# Patient Record
Sex: Male | Born: 1960 | Race: White | Hispanic: No | Marital: Married | Smoking: Never smoker
Health system: Southern US, Community
[De-identification: ages and names within clinical notes are randomized; demographics above are authoritative.]

## PROBLEM LIST (undated history)

## (undated) DIAGNOSIS — G4730 Sleep apnea, unspecified: Secondary | ICD-10-CM

---

## 2010-05-14 ENCOUNTER — Ambulatory Visit: Payer: Self-pay | Admitting: Unknown Physician Specialty

## 2010-05-15 LAB — PATHOLOGY REPORT

## 2017-12-17 ENCOUNTER — Encounter: Payer: Self-pay | Admitting: Emergency Medicine

## 2017-12-17 ENCOUNTER — Other Ambulatory Visit: Payer: Self-pay

## 2017-12-17 ENCOUNTER — Emergency Department
Admission: EM | Admit: 2017-12-17 | Discharge: 2017-12-17 | Disposition: A | Discharge status: Home / Self Care | Payer: 59 | Attending: Emergency Medicine | Admitting: Emergency Medicine

## 2017-12-17 DIAGNOSIS — M5416 Radiculopathy, lumbar region: Secondary | ICD-10-CM | POA: Insufficient documentation

## 2017-12-17 DIAGNOSIS — M545 Low back pain: Secondary | ICD-10-CM | POA: Diagnosis present

## 2017-12-17 MED ORDER — KETOROLAC TROMETHAMINE 30 MG/ML IJ SOLN
30.0000 mg | Freq: Once | INTRAMUSCULAR | Status: AC
  Administered 2017-12-17: 30 mg via INTRAMUSCULAR
  Filled 2017-12-17: qty 1

## 2017-12-17 MED ORDER — CYCLOBENZAPRINE HCL 5 MG PO TABS: ORAL_TABLET | ORAL | 0 refills | Status: DC

## 2017-12-17 MED ORDER — METHYLPREDNISOLONE SODIUM SUCC 40 MG IJ SOLR
40.0000 mg | Freq: Once | INTRAMUSCULAR | Status: AC
  Administered 2017-12-17: 40 mg via INTRAMUSCULAR
  Filled 2017-12-17: qty 1

## 2017-12-17 NOTE — ED Provider Notes (Signed)
[facility] Emergency Department Provider Note  ____________________________________________  Time seen: Approximately 3:22 PM  I have reviewed the triage vital signs and the nursing notes.   HISTORY  Chief Complaint Back Pain    HPI Michael Mckenzie is a 57 y.o. male that presents to the emergency department for evaluation of low back pain for 1 week.  Pain starts in low back and radiates into left leg.  Walking and quick  movements aggravate the pain.  Pain radiates into his thigh and does not extend lower.  He has a history of low back pain and sees a chiropractor monthly.  Patient went to the clinic on Monday, received a Toradol injection and a prescription for Toradol.  He never started the prescription for Toradol because pain improved. Pain worsened again at the end of the week, and he went to the clinic this morning and received a prescription for prednisone.  Pain worsened this afternoon so he came to the emergency department.  He denies bowel or bladder dysfunction or saddle paresthesias.  No nausea, vomiting, abdominal pain.   History reviewed. No pertinent past medical history.  There are no active problems to display for this patient.    Prior to Admission medications   Medication Sig Start Date End Date Taking? Authorizing Provider  cyclobenzaprine (FLEXERIL) 5 MG tablet Take 1-2 tablets 3 times daily as needed 12/17/17   Enid DerryWagner, Jasper Ruminski, PA-C    Allergies Patient has no known allergies.  No family history on file.  Social History Social History   Tobacco Use  . Smoking status: Not on file  Substance Use Topics  . Alcohol use: Not on file  . Drug use: Not on file     Review of Systems  Constitutional: No fever/chills Cardiovascular: No chest pain. Respiratory: No SOB. Gastrointestinal: No abdominal pain.  No nausea, no vomiting.  Musculoskeletal: Positive for back pain. Skin: Negative for rash, abrasions, lacerations,  ecchymosis.   ____________________________________________   PHYSICAL EXAM:  VITAL SIGNS: ED Triage Vitals  Enc Vitals Group     BP 12/17/17 1346 135/79     Pulse Rate 12/17/17 1346 97     Resp 12/17/17 1346 18     Temp 12/17/17 1346 98 F (36.7 C)     Temp Source 12/17/17 1346 Oral     SpO2 12/17/17 1346 97 %     Weight 12/17/17 1347 190 lb (86.2 kg)     Height 12/17/17 1347 5\' 10"  (1.778 m)     Head Circumference --      Peak Flow --      Pain Score 12/17/17 1346 0     Pain Loc --      Pain Edu? --      Excl. in GC? --      Constitutional: Alert and oriented. Well appearing and in no acute distress. Eyes: Conjunctivae are normal. PERRL. EOMI. Head: Atraumatic. ENT:      Ears:      Nose: No congestion/rhinnorhea.      Mouth/Throat: Mucous membranes are moist.  Neck: No stridor.   Cardiovascular: Normal rate, regular rhythm.  Good peripheral circulation. Respiratory: Normal respiratory effort without tachypnea or retractions. Lungs CTAB. Good air entry to the bases with no decreased or absent breath sounds. Gastrointestinal: Bowel sounds 4 quadrants. Soft and nontender to palpation. No guarding or rigidity. No palpable masses. No distention.  Musculoskeletal: Full range of motion to all extremities. No gross deformities appreciated.  No tenderness to  palpation over lumbar spine or paraspinal muscles.  Negative straight leg raise. Normal gait. Neurologic:  Normal speech and language. No gross focal neurologic deficits are appreciated.  Skin:  Skin is warm, dry and intact. No rash noted.   ____________________________________________   LABS (all labs ordered are listed, but only abnormal results are displayed)  Labs Reviewed - No data to display ____________________________________________  EKG   ____________________________________________  RADIOLOGY  No results found.  ____________________________________________    PROCEDURES  Procedure(s)  performed:    Procedures    Medications  methylPREDNISolone sodium succinate (SOLU-MEDROL) 40 mg/mL injection 40 mg (40 mg Intramuscular Given 12/17/17 1540)  ketorolac (TORADOL) 30 MG/ML injection 30 mg (30 mg Intramuscular Given 12/17/17 1541)     ____________________________________________   INITIAL IMPRESSION / ASSESSMENT AND PLAN / ED COURSE  Pertinent labs & imaging results that were available during my care of the patient were reviewed by me and considered in my medical decision making (see chart for details).  Review of the Center CSRS was performed in accordance of the [place] prior to dispensing any controlled drugs.   Patient presented to the emergency department for evaluation of low back pain for 1 week.  Symptoms are consistent with radiculopathy.  Vital signs and exam are reassuring.  Patient was given a shot of IM Solu-Medrol and Toradol in ED.  He will begin his prescriptions for prednisone and Toradol.  He will also be given a prescription for Flexeril.  Imaging was offered and patient would like to wait for outpatient imaging.  Patient is to follow up with PCP as directed. Patient is given ED precautions to return to the ED for any worsening or new symptoms.     ____________________________________________  FINAL CLINICAL IMPRESSION(S) / ED DIAGNOSES  Final diagnoses:  Lumbar radiculopathy      NEW MEDICATIONS STARTED DURING THIS VISIT:  ED Discharge Orders        Ordered    cyclobenzaprine (FLEXERIL) 5 MG tablet     12/17/17 1608          This chart was dictated using voice recognition software/Dragon. Despite best efforts to proofread, errors can occur which can change the meaning. Any change was purely unintentional.    Enid Derry, PA-C 12/17/17 1715    Alphonzo Lemmings Rudy Jew, MD 12/18/17 Moses Manners

## 2017-12-17 NOTE — Discharge Instructions (Signed)
Begin flexeril prescription tonight. Begin prednisone and toradol prescriptions tomorrow.

## 2017-12-17 NOTE — ED Triage Notes (Signed)
Low back pain radiating to L leg, became severe this am with no injury.

## 2017-12-30 ENCOUNTER — Other Ambulatory Visit: Payer: Self-pay | Admitting: Rehabilitation

## 2017-12-30 DIAGNOSIS — M5416 Radiculopathy, lumbar region: Secondary | ICD-10-CM

## 2018-01-12 ENCOUNTER — Ambulatory Visit
Admission: RE | Admit: 2018-01-12 | Discharge: 2018-01-12 | Disposition: A | Discharge status: Home / Self Care | Payer: 59 | Source: Ambulatory Visit | Attending: Rehabilitation | Admitting: Rehabilitation

## 2018-01-12 DIAGNOSIS — M5416 Radiculopathy, lumbar region: Secondary | ICD-10-CM

## 2019-01-04 ENCOUNTER — Telehealth: Payer: Self-pay

## 2019-01-04 NOTE — Telephone Encounter (Signed)
I contacted the pt in regards to his EMG appt scheduled for 01/08/19. I advised due to covid-19 concerns our office is limiting the number of patient's coming into the clinic and we have suspended EMG appt's at this time.  Pt was agreeable to this but requested an e-mail or text message stating the appt has been canceled.  Pt was advised I could send a my chart message and he was agreeable.  I attempted to send the my chart message and was advised the pt did not have an active chart. Pt was called back and left a vm stating I would be unable to send my chart due to account not being active.

## 2019-01-08 ENCOUNTER — Encounter: Payer: 59 | Admitting: Neurology

## 2019-03-01 ENCOUNTER — Other Ambulatory Visit: Payer: Self-pay | Admitting: Rehabilitation

## 2019-03-01 DIAGNOSIS — M5412 Radiculopathy, cervical region: Secondary | ICD-10-CM

## 2019-03-13 ENCOUNTER — Encounter (INDEPENDENT_AMBULATORY_CARE_PROVIDER_SITE_OTHER): Payer: 59 | Admitting: Neurology

## 2019-03-13 ENCOUNTER — Ambulatory Visit
Admission: RE | Admit: 2019-03-13 | Discharge: 2019-03-13 | Disposition: A | Discharge status: Home / Self Care | Payer: 59 | Source: Ambulatory Visit | Attending: Rehabilitation | Admitting: Rehabilitation

## 2019-03-13 ENCOUNTER — Ambulatory Visit (INDEPENDENT_AMBULATORY_CARE_PROVIDER_SITE_OTHER): Payer: 59 | Admitting: Neurology

## 2019-03-13 ENCOUNTER — Other Ambulatory Visit: Payer: Self-pay

## 2019-03-13 DIAGNOSIS — R29898 Other symptoms and signs involving the musculoskeletal system: Secondary | ICD-10-CM

## 2019-03-13 DIAGNOSIS — G5621 Lesion of ulnar nerve, right upper limb: Secondary | ICD-10-CM | POA: Diagnosis not present

## 2019-03-13 DIAGNOSIS — M5412 Radiculopathy, cervical region: Secondary | ICD-10-CM

## 2019-03-13 DIAGNOSIS — Z0289 Encounter for other administrative examinations: Secondary | ICD-10-CM

## 2019-03-13 DIAGNOSIS — G5610 Other lesions of median nerve, unspecified upper limb: Secondary | ICD-10-CM

## 2019-03-13 NOTE — Progress Notes (Signed)
Full Name: Meryle ReadyJohn Frazer Gender: Male MRN #: [id number] Date of Birth: 01/31/1961    Visit Date: 03/13/2019 13:36 Age: [age] Years 0 Months Old Examining Physician: Despina Ariasichard Sater, MD  Referring Physician: Letta Kocherhomas Saullo, MD    History: Mr. Lissa HoardBoone is a 58 year old man with weakness, numbness and atrophy in the right hand.  The numbness is mostly in half of the fourth finger and the fifth finger and adjacent palm.  He first noted the atrophy about 2 months ago.  He has had a little bit of neck pain though nothing severe.  He denies any radiating pain down the arm.  Additionally, he has a history of left wrist trauma (distal radius fracture).  Nerve conduction studies: Right median motor response has a mildly delayed distal latency with normal amplitude and normal forearm conduction.  The left median motor response has a moderately delayed distal latency with reduced amplitude and mildly reduced conduction velocity.  The right ulnar motor response has a borderline delayed distal latency with a markedly reduced amplitude and slowing across the elbow.  The left ulnar motor response was normal.  Right ulnar F-wave response had a delayed latency  Bilateral median sensory responses were slowed across the wrist, left greater than right and the left response also had a reduced amplitude.  The right ulnar sensory response was absent and the left ulnar sensory response was normal.  The radial sensory responses had normal latencies and borderline normal amplitudes.  Electromyography: Needle EMG of selected muscles of the right arm and the left hand was performed. Proximally in the arm, there was mild chronic denervation in the biceps and a mild increase of polyphasic motor units without clear neuropathic recruitment in the deltoid.  He had mixed moderate acute and chronic denervation in the ulnar innervated first dorsal interosseous muscle.  In the left hand, he had mixed mild acute and chronic denervation  in the median innervated APB muscle.  The ulnar innervated first dorsal interosseous was normal.  Impression: This NCV/EMG study shows the following: 1.   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 2.   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   Moderate right median neuropathy at the wrist 4.   Mild chronic right C5 radiculopathy   Richard A. Epimenio FootSater, MD, PhD, FAAN Certified in Neurology, Clinical Neurophysiology, Sleep Medicine, Pain Medicine and Neuroimaging Director, [facility]  [facility] [address], [address] [place], [place] [address] [phone]        [facility]    Nerve / Sites Muscle Latency Ref. Amplitude Ref. Rel Amp Segments Distance Velocity Ref. Area    ms ms mV mV %  cm m/s m/s mVms  R Median - APB     Wrist APB 5.5 ?4.4 4.0 ?4.0 100 Wrist - APB 7   15.8     Upper arm APB 10.0  3.8  94.2 Upper arm - Wrist 22 49 ?49 15.7  L Median - APB     Wrist APB 6.7 ?4.4 2.9 ?4.0 100 Wrist - APB 7   12.4     Upper arm APB 11.9  2.5  85.3 Upper arm - Wrist 22 43 ?49 12.1  R Ulnar - ADM     Wrist ADM 3.5 ?3.3 1.8 ?6.0 100 Wrist - ADM 7   3.7     B.Elbow ADM 8.0  1.7  89.7 B.Elbow - Wrist 20 45 ?  49 3.8     A.Elbow ADM 11.1  1.4  82.5 A.Elbow - B.Elbow 10 32 ?49 3.9         A.Elbow - Wrist      L Ulnar - ADM     Wrist ADM 2.4 ?3.3 9.1 ?6.0 100 Wrist - ADM 7   24.8     B.Elbow ADM 5.4  8.1  88.6 B.Elbow - Wrist 20 66 ?49 23.1     A.Elbow ADM 7.3  7.7  95.1 A.Elbow - B.Elbow 10 52 ?49 22.8         A.Elbow - Wrist                 SNC    Nerve / Sites Rec. Site Peak Lat Ref.  Amp Ref. Segments Distance    ms ms V V  cm  R Radial - Anatomical snuff box (Forearm)     Forearm Wrist 2.7 ?2.9 14 ?15 Forearm - Wrist 10  L Radial - Anatomical snuff box (Forearm)     Forearm Wrist 2.6 ?2.9 15 ?15 Forearm - Wrist 10  R Median - Orthodromic (Dig II, Mid palm)     Dig II Wrist 4.6 ?3.4  10 ?10 Dig II - Wrist 13  L Median - Orthodromic (Dig II, Mid palm)     Dig II Wrist 5.4 ?3.4 3 ?10 Dig II - Wrist 13  R Ulnar - Orthodromic, (Dig V, Mid palm)     Dig V Wrist NR ?3.1 NR ?5 Dig V - Wrist 11  L Ulnar - Orthodromic, (Dig V, Mid palm)     Dig V Wrist 2.7 ?3.1 5 ?5 Dig V - Wrist 22                 F  Wave    Nerve F Lat Ref.   ms ms  R Ulnar - ADM 35.5 ?32.0  L Ulnar - ADM 30.6 ?32.0         EMG full       EMG Summary Table    Spontaneous MUAP Recruitment  Muscle IA Fib PSW Fasc Other Amp Dur. Poly Pattern  R. Biceps brachii Normal None None None _______ Increased Increased 1+ Reduced  R. Deltoid Normal None None None _______ Normal Normal 1+ Normal  R. Triceps brachii Normal None None None _______ Normal Normal 1+ Normal  R. Extensor digitorum communis Normal None None None _______ Normal Normal 1+ Normal  R. Pronator teres Normal None None None _______ Normal Normal Normal Normal  R. Flexor carpi ulnaris Normal None None None _______ Normal Normal Normal Normal  R. First dorsal interosseous Normal 2+ 2+ None _______ Normal Normal 2+ Single  R. Abductor pollicis brevis Normal None None None _______ Normal Normal Normal Normal  L. Abductor pollicis brevis Normal 1+ 1+ None _______ Increased Increased 2+ Reduced  L. First dorsal interosseous Normal None None None _______ Normal Normal Normal Normal

## 2019-03-23 ENCOUNTER — Other Ambulatory Visit: Payer: Self-pay | Admitting: Rehabilitation

## 2019-03-23 DIAGNOSIS — M50121 Cervical disc disorder at C4-C5 level with radiculopathy: Secondary | ICD-10-CM | POA: Insufficient documentation

## 2019-03-23 DIAGNOSIS — G5603 Carpal tunnel syndrome, bilateral upper limbs: Secondary | ICD-10-CM | POA: Insufficient documentation

## 2019-03-23 DIAGNOSIS — M5136 Other intervertebral disc degeneration, lumbar region: Secondary | ICD-10-CM

## 2019-03-30 ENCOUNTER — Encounter: Payer: 59 | Admitting: Neurology

## 2019-04-16 ENCOUNTER — Ambulatory Visit
Admission: RE | Admit: 2019-04-16 | Discharge: 2019-04-16 | Disposition: A | Discharge status: Home / Self Care | Payer: 59 | Source: Ambulatory Visit | Attending: Rehabilitation | Admitting: Rehabilitation

## 2019-04-16 ENCOUNTER — Other Ambulatory Visit: Payer: Self-pay

## 2019-04-16 DIAGNOSIS — M5136 Other intervertebral disc degeneration, lumbar region: Secondary | ICD-10-CM

## 2019-04-26 ENCOUNTER — Other Ambulatory Visit: Payer: Self-pay | Admitting: Orthopaedic Surgery

## 2019-04-26 DIAGNOSIS — M5412 Radiculopathy, cervical region: Secondary | ICD-10-CM

## 2019-05-09 ENCOUNTER — Other Ambulatory Visit: Payer: Self-pay

## 2019-05-09 ENCOUNTER — Ambulatory Visit
Admission: RE | Admit: 2019-05-09 | Discharge: 2019-05-09 | Disposition: A | Discharge status: Home / Self Care | Payer: 59 | Source: Ambulatory Visit | Attending: Orthopaedic Surgery | Admitting: Orthopaedic Surgery

## 2019-05-09 DIAGNOSIS — M5412 Radiculopathy, cervical region: Secondary | ICD-10-CM

## 2019-05-11 ENCOUNTER — Other Ambulatory Visit: Payer: Self-pay | Admitting: Orthopaedic Surgery

## 2019-05-11 DIAGNOSIS — M5126 Other intervertebral disc displacement, lumbar region: Secondary | ICD-10-CM

## 2019-05-15 ENCOUNTER — Other Ambulatory Visit: Payer: Self-pay

## 2019-05-15 ENCOUNTER — Ambulatory Visit
Admission: RE | Admit: 2019-05-15 | Discharge: 2019-05-15 | Disposition: A | Discharge status: Home / Self Care | Payer: 59 | Source: Ambulatory Visit | Attending: Orthopaedic Surgery | Admitting: Orthopaedic Surgery

## 2019-05-15 DIAGNOSIS — M5126 Other intervertebral disc displacement, lumbar region: Secondary | ICD-10-CM

## 2019-05-15 MED ORDER — GADOBENATE DIMEGLUMINE 529 MG/ML IV SOLN
17.0000 mL | Freq: Once | INTRAVENOUS | Status: AC | PRN
  Administered 2019-05-15: 17 mL via INTRAVENOUS

## 2021-03-25 DIAGNOSIS — K5790 Diverticulosis of intestine, part unspecified, without perforation or abscess without bleeding: Secondary | ICD-10-CM | POA: Insufficient documentation

## 2021-07-21 ENCOUNTER — Other Ambulatory Visit: Payer: Self-pay | Admitting: Orthopedic Surgery

## 2021-09-08 ENCOUNTER — Other Ambulatory Visit: Payer: Self-pay

## 2021-09-08 ENCOUNTER — Encounter (HOSPITAL_BASED_OUTPATIENT_CLINIC_OR_DEPARTMENT_OTHER): Payer: Self-pay | Admitting: Orthopedic Surgery

## 2021-09-15 ENCOUNTER — Ambulatory Visit (HOSPITAL_BASED_OUTPATIENT_CLINIC_OR_DEPARTMENT_OTHER)
Admission: RE | Admit: 2021-09-15 | Discharge: 2021-09-15 | Disposition: A | Discharge status: Home / Self Care | Payer: 59 | Source: Ambulatory Visit | Attending: Orthopedic Surgery | Admitting: Orthopedic Surgery

## 2021-09-15 ENCOUNTER — Encounter (HOSPITAL_BASED_OUTPATIENT_CLINIC_OR_DEPARTMENT_OTHER): Payer: Self-pay | Admitting: Orthopedic Surgery

## 2021-09-15 ENCOUNTER — Encounter (HOSPITAL_BASED_OUTPATIENT_CLINIC_OR_DEPARTMENT_OTHER)
Admission: RE | Disposition: A | Discharge status: Home / Self Care | Payer: Self-pay | Source: Ambulatory Visit | Attending: Orthopedic Surgery

## 2021-09-15 ENCOUNTER — Ambulatory Visit (HOSPITAL_BASED_OUTPATIENT_CLINIC_OR_DEPARTMENT_OTHER): Payer: 59 | Admitting: Anesthesiology

## 2021-09-15 ENCOUNTER — Other Ambulatory Visit: Payer: Self-pay

## 2021-09-15 DIAGNOSIS — G5603 Carpal tunnel syndrome, bilateral upper limbs: Secondary | ICD-10-CM | POA: Insufficient documentation

## 2021-09-15 DIAGNOSIS — G4730 Sleep apnea, unspecified: Secondary | ICD-10-CM | POA: Insufficient documentation

## 2021-09-15 DIAGNOSIS — G5601 Carpal tunnel syndrome, right upper limb: Secondary | ICD-10-CM | POA: Diagnosis present

## 2021-09-15 HISTORY — DX: Sleep apnea, unspecified: G47.30

## 2021-09-15 HISTORY — PX: CARPAL TUNNEL RELEASE: SHX101

## 2021-09-15 HISTORY — PX: ULNAR NERVE TRANSPOSITION: SHX2595

## 2021-09-15 SURGERY — CARPAL TUNNEL RELEASE
Anesthesia: Monitor Anesthesia Care | Site: Wrist | Laterality: Right

## 2021-09-15 MED ORDER — LIDOCAINE HCL (CARDIAC) PF 100 MG/5ML IV SOSY
PREFILLED_SYRINGE | INTRAVENOUS | Status: DC | PRN
  Administered 2021-09-15: 40 mg via INTRAVENOUS

## 2021-09-15 MED ORDER — ACETAMINOPHEN 500 MG PO TABS
ORAL_TABLET | ORAL | Status: AC
  Filled 2021-09-15: qty 2

## 2021-09-15 MED ORDER — CEFAZOLIN SODIUM-DEXTROSE 2-4 GM/100ML-% IV SOLN
2.0000 g | INTRAVENOUS | Status: AC
  Administered 2021-09-15: 2 g via INTRAVENOUS

## 2021-09-15 MED ORDER — FENTANYL CITRATE (PF) 100 MCG/2ML IJ SOLN: 25.0000 ug | INTRAMUSCULAR | Status: DC | PRN

## 2021-09-15 MED ORDER — ONDANSETRON HCL 4 MG/2ML IJ SOLN
INTRAMUSCULAR | Status: DC | PRN
  Administered 2021-09-15: 4 mg via INTRAVENOUS

## 2021-09-15 MED ORDER — ROPIVACAINE HCL 5 MG/ML IJ SOLN
INTRAMUSCULAR | Status: DC | PRN
  Administered 2021-09-15: 25 mL via PERINEURAL

## 2021-09-15 MED ORDER — BUPIVACAINE HCL (PF) 0.25 % IJ SOLN
INTRAMUSCULAR | Status: AC
  Filled 2021-09-15: qty 180

## 2021-09-15 MED ORDER — FENTANYL CITRATE (PF) 100 MCG/2ML IJ SOLN
INTRAMUSCULAR | Status: AC
  Filled 2021-09-15: qty 2

## 2021-09-15 MED ORDER — TRAMADOL HCL 50 MG PO TABS: 50.0000 mg | ORAL_TABLET | Freq: Four times a day (QID) | ORAL | 0 refills | Status: DC | PRN

## 2021-09-15 MED ORDER — DEXAMETHASONE SODIUM PHOSPHATE 10 MG/ML IJ SOLN
INTRAMUSCULAR | Status: DC | PRN
  Administered 2021-09-15: 5 mg

## 2021-09-15 MED ORDER — 0.9 % SODIUM CHLORIDE (POUR BTL) OPTIME
TOPICAL | Status: DC | PRN
  Administered 2021-09-15: 200 mL

## 2021-09-15 MED ORDER — MIDAZOLAM HCL 2 MG/2ML IJ SOLN
2.0000 mg | Freq: Once | INTRAMUSCULAR | Status: AC
  Administered 2021-09-15: 2 mg via INTRAVENOUS

## 2021-09-15 MED ORDER — LACTATED RINGERS IV SOLN: INTRAVENOUS | Status: DC

## 2021-09-15 MED ORDER — PROPOFOL 500 MG/50ML IV EMUL
INTRAVENOUS | Status: DC | PRN
  Administered 2021-09-15: 75 ug/kg/min via INTRAVENOUS

## 2021-09-15 MED ORDER — CEFAZOLIN SODIUM-DEXTROSE 2-4 GM/100ML-% IV SOLN
INTRAVENOUS | Status: AC
  Filled 2021-09-15: qty 100

## 2021-09-15 MED ORDER — MIDAZOLAM HCL 2 MG/2ML IJ SOLN
INTRAMUSCULAR | Status: AC
  Filled 2021-09-15: qty 2

## 2021-09-15 MED ORDER — PROPOFOL 10 MG/ML IV BOLUS
INTRAVENOUS | Status: AC
  Filled 2021-09-15: qty 20

## 2021-09-15 MED ORDER — FENTANYL CITRATE (PF) 100 MCG/2ML IJ SOLN
100.0000 ug | Freq: Once | INTRAMUSCULAR | Status: AC
  Administered 2021-09-15: 50 ug via INTRAVENOUS

## 2021-09-15 MED ORDER — ACETAMINOPHEN 500 MG PO TABS
1000.0000 mg | ORAL_TABLET | Freq: Once | ORAL | Status: AC
  Administered 2021-09-15: 1000 mg via ORAL

## 2021-09-15 MED ORDER — PHENYLEPHRINE HCL (PRESSORS) 10 MG/ML IV SOLN
INTRAVENOUS | Status: DC | PRN
  Administered 2021-09-15: 40 ug via INTRAVENOUS

## 2021-09-15 MED ORDER — PROPOFOL 10 MG/ML IV BOLUS
INTRAVENOUS | Status: DC | PRN
  Administered 2021-09-15 (×2): 20 mg via INTRAVENOUS

## 2021-09-15 SURGICAL SUPPLY — 40 items
APL PRP STRL LF DISP 70% ISPRP (MISCELLANEOUS) ×2
BLADE SURG 15 STRL LF DISP TIS (BLADE) ×2 IMPLANT
BLADE SURG 15 STRL SS (BLADE) ×3
BNDG CMPR 9X4 STRL LF SNTH (GAUZE/BANDAGES/DRESSINGS) ×2
BNDG COHESIVE 3X5 TAN ST LF (GAUZE/BANDAGES/DRESSINGS) ×6 IMPLANT
BNDG ESMARK 4X9 LF (GAUZE/BANDAGES/DRESSINGS) ×3 IMPLANT
BNDG GAUZE ELAST 4 BULKY (GAUZE/BANDAGES/DRESSINGS) ×3 IMPLANT
CHLORAPREP W/TINT 26 (MISCELLANEOUS) ×3 IMPLANT
CORD BIPOLAR FORCEPS 12FT (ELECTRODE) ×3 IMPLANT
COVER BACK TABLE 60X90IN (DRAPES) ×3 IMPLANT
COVER MAYO STAND STRL (DRAPES) ×3 IMPLANT
DRAPE EXTREMITY T 121X128X90 (DISPOSABLE) ×3 IMPLANT
DRAPE U-SHAPE 47X51 STRL (DRAPES) ×3 IMPLANT
DRSG PAD ABDOMINAL 8X10 ST (GAUZE/BANDAGES/DRESSINGS) ×3 IMPLANT
GAUZE 4X4 16PLY ~~LOC~~+RFID DBL (SPONGE) ×3 IMPLANT
GAUZE SPONGE 4X4 12PLY STRL (GAUZE/BANDAGES/DRESSINGS) ×3 IMPLANT
GAUZE XEROFORM 1X8 LF (GAUZE/BANDAGES/DRESSINGS) ×3 IMPLANT
GLOVE SURG ENC TEXT LTX SZ7.5 (GLOVE) ×3 IMPLANT
GLOVE SURG ORTHO LTX SZ8 (GLOVE) ×3 IMPLANT
GLOVE SURG POLYISO LF SZ6.5 (GLOVE) ×3 IMPLANT
GLOVE SURG UNDER POLY LF SZ6.5 (GLOVE) ×3 IMPLANT
GLOVE SURG UNDER POLY LF SZ8.5 (GLOVE) ×3 IMPLANT
GOWN STRL REUS W/ TWL LRG LVL3 (GOWN DISPOSABLE) ×4 IMPLANT
GOWN STRL REUS W/TWL LRG LVL3 (GOWN DISPOSABLE) ×6
GOWN STRL REUS W/TWL XL LVL3 (GOWN DISPOSABLE) ×3 IMPLANT
NS IRRIG 1000ML POUR BTL (IV SOLUTION) ×3 IMPLANT
PACK BASIN DAY SURGERY FS (CUSTOM PROCEDURE TRAY) ×3 IMPLANT
PAD CAST 4YDX4 CTTN HI CHSV (CAST SUPPLIES) ×2 IMPLANT
PADDING CAST COTTON 4X4 STRL (CAST SUPPLIES) ×3
SLING ARM FOAM STRAP LRG (SOFTGOODS) ×3 IMPLANT
SPLINT PLASTER CAST XFAST 3X15 (CAST SUPPLIES) ×60 IMPLANT
SPLINT PLASTER XTRA FASTSET 3X (CAST SUPPLIES) ×30
STOCKINETTE 4X48 STRL (DRAPES) ×3 IMPLANT
SUT ETHILON 4 0 PS 2 18 (SUTURE) ×6 IMPLANT
SUT VIC AB 2-0 SH 27 (SUTURE) ×3
SUT VIC AB 2-0 SH 27XBRD (SUTURE) ×2 IMPLANT
SUT VICRYL 4-0 PS2 18IN ABS (SUTURE) ×3 IMPLANT
SYR BULB EAR ULCER 3OZ GRN STR (SYRINGE) ×3 IMPLANT
TOWEL GREEN STERILE FF (TOWEL DISPOSABLE) ×3 IMPLANT
UNDERPAD 30X36 HEAVY ABSORB (UNDERPADS AND DIAPERS) ×3 IMPLANT

## 2021-09-15 NOTE — Brief Op Note (Signed)
09/15/2021  10:00 AM  PATIENT:  Michael Mckenzie  60 y.o. male  PRE-OPERATIVE DIAGNOSIS:  RIGHT CARPAL TUNNEL SYNDROME, RIGHT CUBITAL TUNNEL SYNDROME, RIGHT GUYON'S CANAL  POST-OPERATIVE DIAGNOSIS:  RIGHT CARPAL TUNNEL SYNDROME, RIGHT CUBITAL TUNNEL SYNDROME, RIGHT GUYON'S CANAL  PROCEDURE:  Procedure(s): RIGHT CARPAL TUNNEL RELEASE (Right) DECOMPRESSION WITH TRANSPOSITION ULNAR NERVE ELBOW AND ULNAR NERVE WRIST (Right)  SURGEON:  Surgeon(s) and Role:    Cindee Salt, MD - Primary  PHYSICIAN ASSISTANT:   ASSISTANTS: R Dasnoit PA-C  ANESTHESIA:   regional and IV sedation  EBL: 5 ml  BLOOD ADMINISTERED:none  DRAINS: none   LOCAL MEDICATIONS USED:  NONE  SPECIMEN:  No Specimen  DISPOSITION OF SPECIMEN:  N/A  COUNTS:  YES  TOURNIQUET:  * Missing tourniquet times found for documented tourniquets in log: [id number] *  DICTATION: .Reubin Milan Dictation  PLAN OF CARE: Discharge to home after PACU  PATIENT DISPOSITION:  PACU - hemodynamically stable.

## 2021-09-15 NOTE — Transfer of Care (Signed)
Immediate Anesthesia Transfer of Care Note  Patient: Michael Mckenzie  Procedure(s) Performed: RIGHT CARPAL TUNNEL RELEASE (Right: Wrist) DECOMPRESSION WITH TRANSPOSITION ULNAR NERVE ELBOW AND ULNAR NERVE WRIST (Right: Elbow)  Patient Location: PACU  Anesthesia Type:MAC combined with regional for post-op pain  Level of Consciousness: awake, alert  and oriented  Airway & Oxygen Therapy: Patient Spontanous Breathing and Patient connected to face mask oxygen  Post-op Assessment: Report given to RN and Post -op Vital signs reviewed and stable  Post vital signs: Reviewed and stable  Last Vitals:  Vitals Value Taken Time  BP 119/77 09/15/21 1022  Temp    Pulse 82 09/15/21 1024  Resp 16 09/15/21 1024  SpO2 96 % 09/15/21 1024  Vitals shown include unvalidated device data.  Last Pain:  Vitals:   09/15/21 0706  TempSrc: Oral  PainSc: 0-No pain      Patients Stated Pain Goal: 4 (83/67/25 5001)  Complications: No notable events documented.

## 2021-09-15 NOTE — Op Note (Signed)
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   ASSISTANT: Annye Rusk, [facility]   ANESTHESIA:  Regional with sedation   INTRAVENOUS FLUIDS:  Per anesthesia flow sheet.   ESTIMATED BLOOD LOSS:  Minimal.   COMPLICATIONS:  None.   SPECIMENS:  none   TOURNIQUET TIME:   * Missing tourniquet times found for documented tourniquets in log: [id number] *   DISPOSITION:  Stable to PACU.   INDICATIONS: Patient is a 60 year old male with a history of numbness and tingling bilateral arms.  He has undergone a cervical fusion and decompression of the ulnar nerve at his right elbow.  He has had continued symptoms with positive nerve conductions with a compression of the ulnar nerve at his elbow ulnar nerve at his wrist and median nerve at his wrist.  He is admitted for decompression of the median and ulnar nerve at his wrist and decompression possible transposition to the ulnar nerve at his elbow right side.  Pre-.  Postoperative course been discussed along with risks and complications.  He is aware that there is no guarantee to the surgery the possibility of infection recurrence injury to arteries nerves tendons incomplete relief symptoms dystrophy.  The preoperative area the patient is seen the extremity marked by both patient and surgeon antibiotic given.  A supraclavicular block was carried out without difficulty under the direction the anesthesia department.  OPERATIVE COURSE: Patient is brought to the operating room  placed in a supine position with the right arm free.  Prep was done with ChloraPrep.  A 3-minute dry time was allowed timeout taken confirming patient and procedure.  The limb was exsanguinated with an Esmarch bandage turn placed high on the arm was inflated to 250 mmHg.  The wrist was approached first.  A longitudinal incision was made in the palm right side carried down through subcutaneous tissue this was then brought to the ulnar side just proximal to the Cibolo form.  This carried down through subcutaneous tissue.  Bleeders were electrocauterized with bipolar.  The median nerve was identified proximally and traced distally releasing the flexor retinaculum to the level of the superficial palmar arch.  Motor branch entered into muscle distally.  The area compression of the nerve was apparent.  No further lesions were identified.  The ulnar nerve was then identified at the wrist level just proximal to the Pisiform along with the ulnar artery.  This was traced distally releasing Guyon's canal.  The superficial branch was released over its entire course.  The motor branch was then identified and traced through to the hook of the hamate releasing the muscle from above it.  Further lesions were identified.  The wound was copiously irrigated with saline.  The skin was closed interrupted 4-0 nylon sutures.  Attention was then directed proximally.  A incision was then made using the old incision on the elbow carried proximally and distally.  Dissection was carried through subcu subcutaneous tissue bleeders were again electrocauterized with bipolar.  The ulnar nerve was identified identified proximally.  It  was also identified distally as entered into the flexor carpi ulnaris muscle which was split allowing visualization of the normal nerve distally this was then traced proximally and distally freeing the entire ulnar nerve from a considerable amount of encasing scar.  Decided at this point to transpose the subcutaneously.   The medial intermuscular septum was then released proximally down to the medial epicondyle of the elbow where it was left attached.  A portion of the origin of the flexor carpi ulnaris was then detached from the muscle and followed proximally to the its attachment on the medial epicondyle where it was left attached this form to slings.  The nerve was then anteriorly transposed.  The 2 slings proximally and distally were then sutured to the subcutaneous tissue on the anterior flap with flexion extension the nerve glided through the area.  The suturing was done with 2-0 Vicryl sutures.  The wound was copiously irrigated with saline and closed in layers with 4 -0 Vicryl the subcutaneous tissue and 4-0 nylon in the skin.  Sterile compressive dressing and splint to the elbow in 90 degrees of flexion was applied.  Deflation of the tourniquet all fingers immediately pink.  He was taken to the recovery room for observation in satisfactory condition.  He will be discharged home to return to the hand center [facility] in 1 week Tylenol on Tylenol ibuprofen for pain with Ultram for breakthrough.  Cindee Salt, MD Electronically signed, 09/15/21

## 2021-09-15 NOTE — H&P (Signed)
Michael Mckenzie is an 60 y.o. male.   Chief Complaint: numbness right hand HPI: Michael Mckenzie is a 60 year old right-hand-dominant male who was last seen on 03/23/2019. He was seen at that time and diagnosed with bilateral carpal tunnel syndrome ulnar neuropathy at his elbow and cervical disc disorder with radiculopathy. He was referred to Dr. Noel Gerold at that time for examination of his neck. 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 He did not ever see Dr. Noel Gerold. [facility] by Dr. Peyton Najjar who performed a cervical fusion 4-6 along with a cubital tunnel decompression in August 2020. He did not have the carpal tunnels released at that time. He has done well until approximately a week ago but has begun having some pain in his right middle finger with a feeling of tenderness soreness at the PIP joint he took meloxicam which has helped. He does have some numbness in in the area. He has no history of injury. States nothing seems to make it better or worse. He states it seems to be improving. He has no history of diabetes thyroid problems. He has no gout. He was scheduled for nerve conductions which have been done by by Dr. Raelene Bott. These reveal carpal tunnel syndrome bilaterally with a motor delay of 5 7 on the right 8 2 on the left ulnar nerve is also slow on his right side he has a ulnar neuropathy at the elbow bilaterally. He also has a chronic see 5 radiculopathy with a history of C4-5 cervical surgery there is a C8 radiculopathy in addition. His hands have settled down somewhat he continues complain numbness and tingling. This more on the right than the left. Right side bothers him more than the left. He states nothing makes it better or worse. He has no history of diabetes thyroid problems. He does not have a history of gout. Does have history of arthritis Past Medical History:   Diagnosis Date   Sleep apnea     History reviewed. No pertinent surgical history.  History reviewed. No pertinent family history. Social History:  reports that he has never smoked. He quit smokeless tobacco use about 10 years ago. He reports that he does not drink alcohol and does not use drugs.  Allergies: No Known Allergies  No medications prior to admission.    No results found for this or any previous visit (from the past 48 hour(s)).  No results found.   Pertinent items are noted in HPI.  Height 5\' 11"  (1.803 m), weight 81.6 kg.  General appearance: alert, cooperative, and appears stated age Head: Normocephalic, without obvious abnormality Neck: no JVD Resp: clear to auscultation bilaterally Cardio: regular rate and rhythm, S1, S2 normal, no murmur, click, rub or gallop GI: soft, non-tender; bowel sounds normal; no masses,  no organomegaly Extremities: numbness right hand Pulses: 2+ and symmetric Skin: Skin color, texture, turgor normal. No rashes or lesions Neurologic: Grossly normal Incision/Wound: na  Assessment/Plan Diagnosis is carpal tunnel bilateral cubital tunnel syndrome bilateral and compression ulnar nerve the Guyon's canal on the right Plan: We have personally reviewed his nerve conductions and discussed these with him. We have discussed possibility of surgical decompression of the median nerve on his right wrist along with ulnar nerve at elbow and wrist. He is aware that we are attempting to halt the process of allowing the nerve the opportunity to get better  but cannot guarantee that. Preperi-and postoperative course are discussed along with risk and complications. He is advised of the potential for injury to arteries nerves tendons possibility of infection incomplete relief of symptoms dystrophy. He would like to proceed and is scheduled for decompression possible transposition ulnar nerve at his right elbow carpal tunnel at his wrist  and release Guyon's  canal right wrist as an outpatient under regional anesthesia Cindee Salt 09/15/2021, 5:16 AM

## 2021-09-15 NOTE — Anesthesia Preprocedure Evaluation (Addendum)
Anesthesia Evaluation  Patient identified by MRN, date of birth, ID band Patient awake    Reviewed: Allergy & Precautions, NPO status , Patient's Chart, lab work & pertinent test results  Airway Mallampati: III  TM Distance: >3 FB Neck ROM: Full    Dental no notable dental hx. (+) Teeth Intact, Dental Advisory Given   Pulmonary sleep apnea ,    Pulmonary exam normal breath sounds clear to auscultation       Cardiovascular negative cardio ROS Normal cardiovascular exam Rhythm:Regular Rate:Normal     Neuro/Psych negative neurological ROS  negative psych ROS   GI/Hepatic negative GI ROS, Neg liver ROS,   Endo/Other  negative endocrine ROS  Renal/GU negative Renal ROS  negative genitourinary   Musculoskeletal negative musculoskeletal ROS (+)   Abdominal   Peds  Hematology negative hematology ROS (+)   Anesthesia Other Findings   Reproductive/Obstetrics                            Anesthesia Physical Anesthesia Plan  ASA: 2  Anesthesia Plan: MAC and Regional   Post-op Pain Management: Regional block and Tylenol PO (pre-op)   Induction: Intravenous  PONV Risk Score and Plan: 1 and Propofol infusion, Treatment may vary due to age or medical condition, Midazolam and Ondansetron  Airway Management Planned: Natural Airway  Additional Equipment:   Intra-op Plan:   Post-operative Plan:   Informed Consent: I have reviewed the patients History and Physical, chart, labs and discussed the procedure including the risks, benefits and alternatives for the proposed anesthesia with the patient or authorized representative who has indicated his/her understanding and acceptance.     Dental advisory given  Plan Discussed with: CRNA  Anesthesia Plan Comments:         Anesthesia Quick Evaluation

## 2021-09-15 NOTE — Anesthesia Postprocedure Evaluation (Signed)
Anesthesia Post Note  Patient: Michael Mckenzie  Procedure(s) Performed: RIGHT CARPAL TUNNEL RELEASE (Right: Wrist) DECOMPRESSION WITH TRANSPOSITION ULNAR NERVE ELBOW AND ULNAR NERVE WRIST (Right: Elbow)     Patient location during evaluation: PACU Anesthesia Type: Regional and MAC Level of consciousness: awake and alert Pain management: pain level controlled Vital Signs Assessment: post-procedure vital signs reviewed and stable Respiratory status: spontaneous breathing, nonlabored ventilation, respiratory function stable and patient connected to nasal cannula oxygen Cardiovascular status: stable and blood pressure returned to baseline Postop Assessment: no apparent nausea or vomiting Anesthetic complications: no   No notable events documented.  Last Vitals:  Vitals:   09/15/21 1045 09/15/21 1126  BP: 112/79 (!) 115/103  Pulse: 81 82  Resp: 17 18  Temp:  36.4 C  SpO2: 96% 96%    Last Pain:  Vitals:   09/15/21 1126  TempSrc:   PainSc: 0-No pain                 Juanya Villavicencio L Para Cossey

## 2021-09-15 NOTE — Progress Notes (Signed)
Assisted Dr. Woodrum with right, ultrasound guided, supraclavicular block. Side rails up, monitors on throughout procedure. See vital signs in flow sheet. Tolerated Procedure well. 

## 2021-09-15 NOTE — Discharge Instructions (Addendum)
[facility] Instructions Hand Surgery  Wound Care: Keep your hand elevated above the level of your heart.  Do not allow it to dangle by your side.  Keep the dressing dry and do not remove it unless your doctor advises you to do so.  He will usually change it at the time of your post-op visit.  Moving your fingers is advised to stimulate circulation but will depend on the site of your surgery.  If you have a splint applied, your doctor will advise you regarding movement.  Activity: Do not drive or operate machinery today.  Rest today and then you may return to your normal activity and work as indicated by your physician.  Diet:  Drink liquids today or eat a light diet.  You may resume a regular diet tomorrow.    General expectations: Pain for two to three days. Fingers may become slightly swollen.  Call your doctor if any of the following occur: Severe pain not relieved by pain medication. Elevated temperature. Dressing soaked with blood. Inability to move fingers. White or bluish color to fingers.   Next dose of OTC Tylenol after 1:20 pm as needed for pain.  Regional Anesthesia Blocks  1. Numbness or the inability to move the "blocked" extremity may last from 3-48 hours after placement. The length of time depends on the medication injected and your individual response to the medication. If the numbness is not going away after 48 hours, call your surgeon.  2. The extremity that is blocked will need to be protected until the numbness is gone and the  Strength has returned. Because you cannot feel it, you will need to take extra care to avoid injury. Because it may be weak, you may have difficulty moving it or using it. You may not know what position it is in without looking at it while the block is in effect.  3. For blocks in the legs and feet, returning to weight bearing and walking needs to be done carefully. You will need to wait until the numbness is entirely gone and the strength  has returned. You should be able to move your leg and foot normally before you try and bear weight or walk. You will need someone to be with you when you first try to ensure you do not fall and possibly risk injury.  4. Bruising and tenderness at the needle site are common side effects and will resolve in a few days.  5. Persistent numbness or new problems with movement should be communicated to the surgeon or the [facility] [phone] [facility] [phone]).    Post Anesthesia Home Care Instructions  Activity: Get plenty of rest for the remainder of the day. A responsible individual must stay with you for 24 hours following the procedure.  For the next 24 hours, DO NOT: -Drive a car -Advertising copywriter -Drink alcoholic beverages -Take any medication unless instructed by your physician -Make any legal decisions or sign important papers.  Meals: Start with liquid foods such as gelatin or soup. Progress to regular foods as tolerated. Avoid greasy, spicy, heavy foods. If nausea and/or vomiting occur, drink only clear liquids until the nausea and/or vomiting subsides. Call your physician if vomiting continues.  Special Instructions/Symptoms: Your throat may feel dry or sore from the anesthesia or the breathing tube placed in your throat during surgery. If this causes discomfort, gargle with warm salt water. The discomfort should disappear within 24 hours.  If you had a  scopolamine patch placed behind your ear for the management of post- operative nausea and/or vomiting:  1. The medication in the patch is effective for 72 hours, after which it should be removed.  Wrap patch in a tissue and discard in the trash. Wash hands thoroughly with soap and water. 2. You may remove the patch earlier than 72 hours if you experience unpleasant side effects which may include dry mouth, dizziness or visual disturbances. 3. Avoid touching the patch. Wash your hands with soap  and water after contact with the patch.

## 2021-09-15 NOTE — Anesthesia Procedure Notes (Signed)
Anesthesia Regional Block: Supraclavicular block   Pre-Anesthetic Checklist: , timeout performed,  Correct Patient, Correct Site, Correct Laterality,  Correct Procedure, Correct Position, site marked,  Risks and benefits discussed,  Surgical consent,  Pre-op evaluation,  At surgeon's request and post-op pain management  Laterality: Right  Prep: Maximum Sterile Barrier Precautions used, chloraprep       Needles:  Injection technique: Single-shot  Needle Type: Echogenic Stimulator Needle     Needle Length: 5cm  Needle Gauge: 22     Additional Needles:   Procedures:,,,, ultrasound used (permanent image in chart),,    Narrative:  Start time: 09/15/2021 8:05 AM End time: 09/15/2021 8:10 AM Injection made incrementally with aspirations every 5 mL.  Performed by: Personally  Anesthesiologist: Elmer Picker, MD  Additional Notes: Monitors applied. No increased pain on injection. No increased resistance to injection. Injection made in 5cc increments. Good needle visualization. Patient tolerated procedure well.

## 2021-09-17 ENCOUNTER — Encounter (HOSPITAL_BASED_OUTPATIENT_CLINIC_OR_DEPARTMENT_OTHER): Payer: Self-pay | Admitting: Orthopedic Surgery

## 2021-11-06 ENCOUNTER — Other Ambulatory Visit: Payer: Self-pay | Admitting: Neurosurgery

## 2021-11-06 DIAGNOSIS — M5136 Other intervertebral disc degeneration, lumbar region: Secondary | ICD-10-CM

## 2021-11-06 DIAGNOSIS — M898X8 Other specified disorders of bone, other site: Secondary | ICD-10-CM

## 2021-11-06 DIAGNOSIS — M5416 Radiculopathy, lumbar region: Secondary | ICD-10-CM

## 2021-11-16 ENCOUNTER — Other Ambulatory Visit: Payer: Self-pay

## 2021-11-16 ENCOUNTER — Ambulatory Visit
Admission: RE | Admit: 2021-11-16 | Discharge: 2021-11-16 | Disposition: A | Discharge status: Home / Self Care | Payer: 59 | Source: Ambulatory Visit | Attending: Neurosurgery | Admitting: Neurosurgery

## 2021-11-16 DIAGNOSIS — M898X8 Other specified disorders of bone, other site: Secondary | ICD-10-CM | POA: Diagnosis present

## 2021-11-16 DIAGNOSIS — M5416 Radiculopathy, lumbar region: Secondary | ICD-10-CM | POA: Insufficient documentation

## 2021-11-16 DIAGNOSIS — M5136 Other intervertebral disc degeneration, lumbar region: Secondary | ICD-10-CM | POA: Insufficient documentation

## 2021-11-16 MED ORDER — GADOBUTROL 1 MMOL/ML IV SOLN
7.5000 mL | Freq: Once | INTRAVENOUS | Status: AC | PRN
  Administered 2021-11-16: 7 mL via INTRAVENOUS

## 2022-06-28 ENCOUNTER — Other Ambulatory Visit: Payer: Self-pay

## 2022-06-28 NOTE — Telephone Encounter (Signed)
Received refill request fax from [facility] for meloxicam 7.5mg . Pt was seen by Cooper Render, PA one time in January 2023 and was subsequently referred to Physiatry. Sent fax back that request was denied, pt should request from refills from PCP. Sent pt mychart message notifying him of this.

## 2023-02-03 DIAGNOSIS — G4733 Obstructive sleep apnea (adult) (pediatric): Secondary | ICD-10-CM | POA: Insufficient documentation

## 2023-02-03 DIAGNOSIS — E785 Hyperlipidemia, unspecified: Secondary | ICD-10-CM | POA: Insufficient documentation

## 2023-03-16 DIAGNOSIS — Z9889 Other specified postprocedural states: Secondary | ICD-10-CM | POA: Insufficient documentation

## 2023-03-16 HISTORY — PX: MITRAL VALVE SURGERY: SHX714

## 2023-04-12 ENCOUNTER — Encounter: Payer: 59 | Attending: Cardiovascular Disease | Admitting: *Deleted

## 2023-04-12 DIAGNOSIS — Z952 Presence of prosthetic heart valve: Secondary | ICD-10-CM | POA: Insufficient documentation

## 2023-04-12 DIAGNOSIS — Z48812 Encounter for surgical aftercare following surgery on the circulatory system: Secondary | ICD-10-CM | POA: Insufficient documentation

## 2023-04-12 NOTE — Progress Notes (Signed)
Initial phone call completed. Diagnosis can be found in [facility] 6/4. EP Orientation scheduled for Monday 7/8 at 3pm.

## 2023-04-18 ENCOUNTER — Encounter: Payer: 59 | Admitting: *Deleted

## 2023-04-18 VITALS — Ht 69.0 in | Wt 181.7 lb

## 2023-04-18 DIAGNOSIS — Z952 Presence of prosthetic heart valve: Secondary | ICD-10-CM | POA: Diagnosis present

## 2023-04-18 DIAGNOSIS — Z48812 Encounter for surgical aftercare following surgery on the circulatory system: Secondary | ICD-10-CM | POA: Diagnosis present

## 2023-04-18 NOTE — Progress Notes (Signed)
Cardiac Individual Treatment Plan  Patient Details  Name: Michael Mckenzie MRN: [id number] Date of Birth: December 07, 1960 Referring Provider:   Flowsheet Row Cardiac Rehab from 04/18/2023 in [facility] Cardiac and Pulmonary Rehab  Referring Provider Dr. Sampson Goon       Initial Encounter Date:  Flowsheet Row Cardiac Rehab from 04/18/2023 in [facility] Cardiac and Pulmonary Rehab  Date 04/18/23       Visit Diagnosis: S/P mitral valve replacement  Patient's Home Medications on Admission:  Current Outpatient Medications:    amiodarone (PACERONE) 200 MG tablet, Take 200 mg by mouth daily., Disp: , Rfl:    aspirin 81 MG chewable tablet, Chew 81 mg by mouth daily., Disp: , Rfl:    atorvastatin (LIPITOR) 40 MG tablet, Take 40 mg by mouth daily., Disp: , Rfl:    Calcium Citrate-Vitamin D (CALCIUM CITRATE + D PO), Take by mouth daily., Disp: , Rfl:    cyclobenzaprine (FLEXERIL) 5 MG tablet, Take 1-2 tablets 3 times daily as needed, Disp: 20 tablet, Rfl: 0   furosemide (LASIX) 20 MG tablet, Take 20 mg by mouth daily., Disp: , Rfl:    GLUCOSAMINE-CHONDROITIN PO, Take by mouth daily., Disp: , Rfl:    magnesium oxide (MAG-OX) 400 MG tablet, Take 400 mg by mouth daily., Disp: , Rfl:    Multiple Vitamin (MULTIVITAMIN ADULT PO), Take 1 tablet by mouth daily., Disp: , Rfl:    Omega-3 Fatty Acids (FISH OIL PO), Take by mouth., Disp: , Rfl:    Potassium 99 MG TABS, Take 1 tablet by mouth daily., Disp: , Rfl:   Past Medical History: Past Medical History:  Diagnosis Date   Sleep apnea     Tobacco Use: Social History   Tobacco Use  Smoking Status Never  Smokeless Tobacco Former   Quit date: 08/26/2011    Labs: Review Flowsheet        No data to display           Exercise Target Goals: Exercise Program Goal: Individual exercise prescription set using results from initial 6 min walk test and THRR while considering  patient's activity barriers and safety.   Exercise Prescription Goal: Initial exercise  prescription builds to 30-45 minutes a day of aerobic activity, 2-3 days per week.  Home exercise guidelines will be given to patient during program as part of exercise prescription that the participant will acknowledge.   Education: Aerobic Exercise: - Group verbal and visual presentation on the components of exercise prescription. Introduces F.I.T.T principle from ACSM for exercise prescriptions.  Reviews F.I.T.T. principles of aerobic exercise including progression. Written material given at graduation.   Education: Resistance Exercise: - Group verbal and visual presentation on the components of exercise prescription. Introduces F.I.T.T principle from ACSM for exercise prescriptions  Reviews F.I.T.T. principles of resistance exercise including progression. Written material given at graduation.    Education: Exercise & Equipment Safety: - Individual verbal instruction and demonstration of equipment use and safety with use of the equipment. Flowsheet Row Cardiac Rehab from 04/18/2023 in [facility] Cardiac and Pulmonary Rehab  Date 04/18/23  Educator [facility]  Instruction Review Code 1- Verbalizes Understanding       Education: Exercise Physiology & General Exercise Guidelines: - Group verbal and written instruction with models to review the exercise physiology of the cardiovascular system and associated critical values. Provides general exercise guidelines with specific guidelines to those with heart or lung disease.    Education: Flexibility, Balance, Mind/Body Relaxation: - Group verbal and visual presentation with interactive activity  on the components of exercise prescription. Introduces F.I.T.T principle from ACSM for exercise prescriptions. Reviews F.I.T.T. principles of flexibility and balance exercise training including progression. Also discusses the mind body connection.  Reviews various relaxation techniques to help reduce and manage stress (i.e. Deep breathing, progressive muscle relaxation,  and visualization). Balance handout provided to take home. Written material given at graduation.   Activity Barriers & Risk Stratification:  Activity Barriers & Cardiac Risk Stratification - 04/18/23 1718       Activity Barriers & Cardiac Risk Stratification   Activity Barriers Joint Problems             6 Minute Walk:  6 Minute Walk     Row Name 04/18/23 1717         6 Minute Walk   Phase Initial     Distance 1540 feet     Walk Time 6 minutes     # of Rest Breaks 0     MPH 2.92     METS 4.23     RPE 10     Perceived Dyspnea  0     VO2 Peak 14.8     Symptoms No     Resting HR 85 bpm     Resting BP 132/80     Resting Oxygen Saturation  97 %     Exercise Oxygen Saturation  during 6 min walk 99 %     Max Ex. HR 119 bpm     Max Ex. BP 162/82     2 Minute Post BP 132/80              Oxygen Initial Assessment:   Oxygen Re-Evaluation:   Oxygen Discharge (Final Oxygen Re-Evaluation):   Initial Exercise Prescription:  Initial Exercise Prescription - 04/18/23 1700       Date of Initial Exercise RX and Referring Provider   Date 04/18/23    Referring Provider Dr. Sampson Goon      Oxygen   Maintain Oxygen Saturation 88% or higher      Treadmill   MPH 3    Grade 1    Minutes 15    METs 3.71      Recumbant Elliptical   Level 2    RPM 50    Minutes 15    METs 4.23      REL-XR   Level 2    Speed 50    Minutes 15    METs 4.23      Prescription Details   Frequency (times per week) 3    Duration Progress to 30 minutes of continuous aerobic without signs/symptoms of physical distress      Intensity   THRR 40-80% of Max Heartrate 113-142    Ratings of Perceived Exertion 11-13    Perceived Dyspnea 0-4      Progression   Progression Continue to progress workloads to maintain intensity without signs/symptoms of physical distress.      Resistance Training   Training Prescription Yes    Weight 3    Reps 10-15             Perform  Capillary Blood Glucose checks as needed.  Exercise Prescription Changes:   Exercise Comments:   Exercise Goals and Review:   Exercise Goals Re-Evaluation :   Discharge Exercise Prescription (Final Exercise Prescription Changes):   Nutrition:  Target Goals: Understanding of nutrition guidelines, daily intake of sodium 1500mg , cholesterol 200mg , calories 30% from fat and 7% or less from saturated fats, daily  to have 5 or more servings of fruits and vegetables.  Education: All About Nutrition: -Group instruction provided by verbal, written material, interactive activities, discussions, models, and posters to present general guidelines for heart healthy nutrition including fat, fiber, MyPlate, the role of sodium in heart healthy nutrition, utilization of the nutrition label, and utilization of this knowledge for meal planning. Follow up email sent as well. Written material given at graduation. Flowsheet Row Cardiac Rehab from 04/18/2023 in [facility] Cardiac and Pulmonary Rehab  Education need identified 04/18/23       Biometrics:    Nutrition Therapy Plan and Nutrition Goals:   Nutrition Assessments:  MEDIFICTS Score Key: ?70 Need to make dietary changes  40-70 Heart Healthy Diet ? 40 Therapeutic Level Cholesterol Diet   Picture Your Plate Scores: <16 Unhealthy dietary pattern with much room for improvement. 41-50 Dietary pattern unlikely to meet recommendations for good health and room for improvement. 51-60 More healthful dietary pattern, with some room for improvement.  >60 Healthy dietary pattern, although there may be some specific behaviors that could be improved.    Nutrition Goals Re-Evaluation:   Nutrition Goals Discharge (Final Nutrition Goals Re-Evaluation):   Psychosocial: Target Goals: Acknowledge presence or absence of significant depression and/or stress, maximize coping skills, provide positive support system. Participant is able to verbalize types and  ability to use techniques and skills needed for reducing stress and depression.   Education: Stress, Anxiety, and Depression - Group verbal and visual presentation to define topics covered.  Reviews how body is impacted by stress, anxiety, and depression.  Also discusses healthy ways to reduce stress and to treat/manage anxiety and depression.  Written material given at graduation.   Education: Sleep Hygiene -Provides group verbal and written instruction about how sleep can affect your health.  Define sleep hygiene, discuss sleep cycles and impact of sleep habits. Review good sleep hygiene tips.    Initial Review & Psychosocial Screening:  Initial Psych Review & Screening - 04/12/23 1318       Initial Review   Current issues with Current Stress Concerns    Comments healing process, off routine      Family Dynamics   Good Support System? Yes   wife, daughter     Barriers   Psychosocial barriers to participate in program There are no identifiable barriers or psychosocial needs.;The patient should benefit from training in stress management and relaxation.      Screening Interventions   Interventions Encouraged to exercise;Provide feedback about the scores to participant;To provide support and resources with identified psychosocial needs    Expected Outcomes Short Term goal: Utilizing psychosocial counselor, staff and physician to assist with identification of specific Stressors or current issues interfering with healing process. Setting desired goal for each stressor or current issue identified.;Long Term Goal: Stressors or current issues are controlled or eliminated.;Short Term goal: Identification and review with participant of any Quality of Life or Depression concerns found by scoring the questionnaire.;Long Term goal: The participant improves quality of Life and PHQ9 Scores as seen by post scores and/or verbalization of changes             Quality of Life Scores:   Scores of 19  and below usually indicate a poorer quality of life in these areas.  A difference of  2-3 points is a clinically meaningful difference.  A difference of 2-3 points in the total score of the Quality of Life Index has been associated with significant improvement in overall quality of  life, self-image, physical symptoms, and general health in studies assessing change in quality of life.  PHQ-9: Review Flowsheet        No data to display         Interpretation of Total Score  Total Score Depression Severity:  1-4 = Minimal depression, 5-9 = Mild depression, 10-14 = Moderate depression, 15-19 = Moderately severe depression, 20-27 = Severe depression   Psychosocial Evaluation and Intervention:  Psychosocial Evaluation - 04/12/23 1326       Psychosocial Evaluation & Interventions   Interventions Encouraged to exercise with the program and follow exercise prescription    Comments Arvo is 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 He originally went to his doctor for a cough and they found the heart murmer, so needing heart surgery was a surprise. He is currently on medical leave from work with plans to return when his doctor clears him. His wife and daughter are his main support system. He mentions his wife does have other health concerns, but doing well currently. He feels like he is healing well from surgery. 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 He is hopeful with time the nerve issues recover and he is working with his dentist regarding the dental pain. He is looking forward to coming to the program to work on his exercise routine and education    Expected Outcomes Short: attend cardiac rehab for education and exercise. Long: develop and maintain positive self care habits.    Continue Psychosocial Services  Follow up required by staff              Psychosocial Re-Evaluation:   Psychosocial Discharge (Final Psychosocial Re-Evaluation):   Vocational Rehabilitation: Provide vocational rehab assistance to qualifying candidates.   Vocational Rehab Evaluation & Intervention:  Vocational Rehab - 04/12/23 1309       Initial Vocational Rehab Evaluation & Intervention   Assessment shows need for Vocational Rehabilitation No             Education: Education Goals: Education classes will be provided on a variety of topics geared toward better understanding of heart health and risk factor modification. Participant will state understanding/return demonstration of topics presented as noted by education test scores.  Learning Barriers/Preferences:  Learning Barriers/Preferences - 04/12/23 1308       Learning Barriers/Preferences   Learning Barriers None    Learning Preferences None             General Cardiac Education Topics:  AED/CPR: - Group verbal and written instruction with the use of models to demonstrate the basic use of the AED with the basic ABC's of resuscitation.   Anatomy and Cardiac Procedures: - Group verbal and visual presentation and models provide information about basic cardiac anatomy and function. Reviews the testing methods done to diagnose heart disease and the outcomes of the test results. Describes the treatment choices: Medical Management, Angioplasty, or Coronary Bypass Surgery for treating various heart conditions including Myocardial Infarction, Angina, Valve Disease, and Cardiac Arrhythmias.  Written material given at graduation. Flowsheet Row Cardiac Rehab from 04/18/2023 in [facility] Cardiac and Pulmonary Rehab  Education need identified 04/18/23       Medication Safety: - Group verbal and visual instruction to review commonly prescribed medications for heart and lung disease. Reviews the medication, class of the drug, and side effects. Includes the  steps to properly store meds and maintain  the prescription regimen.  Written material given at graduation.   Intimacy: - Group verbal instruction through game format to discuss how heart and lung disease can affect sexual intimacy. Written material given at graduation..   Know Your Numbers and Heart Failure: - Group verbal and visual instruction to discuss disease risk factors for cardiac and pulmonary disease and treatment options.  Reviews associated critical values for Overweight/Obesity, Hypertension, Cholesterol, and Diabetes.  Discusses basics of heart failure: signs/symptoms and treatments.  Introduces Heart Failure Zone chart for action plan for heart failure.  Written material given at graduation.   Infection Prevention: - Provides verbal and written material to individual with discussion of infection control including proper hand washing and proper equipment cleaning during exercise session. Flowsheet Row Cardiac Rehab from 04/18/2023 in [facility] Cardiac and Pulmonary Rehab  Date 04/18/23  Educator [facility]  Instruction Review Code 1- Verbalizes Understanding       Falls Prevention: - Provides verbal and written material to individual with discussion of falls prevention and safety. Flowsheet Row Cardiac Rehab from 04/18/2023 in [facility] Cardiac and Pulmonary Rehab  Date 04/18/23  Educator [facility]  Instruction Review Code 1- Verbalizes Understanding       Other: -Provides group and verbal instruction on various topics (see comments)   Knowledge Questionnaire Score:   Core Components/Risk Factors/Patient Goals at Admission:  Personal Goals and Risk Factors at Admission - 04/12/23 1308       Core Components/Risk Factors/Patient Goals on Admission   Lipids Yes    Intervention Provide education and support for participant on nutrition & aerobic/resistive exercise along with prescribed medications to achieve LDL 70mg , HDL >40mg .    Expected Outcomes Short Term: Participant states understanding of desired cholesterol values and is  compliant with medications prescribed. Participant is following exercise prescription and nutrition guidelines.;Long Term: Cholesterol controlled with medications as prescribed, with individualized exercise RX and with personalized nutrition plan. Value goals: LDL < 70mg , HDL > 40 mg.             Education:Diabetes - Individual verbal and written instruction to review signs/symptoms of diabetes, desired ranges of glucose level fasting, after meals and with exercise. Acknowledge that pre and post exercise glucose checks will be done for 3 sessions at entry of program.   Core Components/Risk Factors/Patient Goals Review:    Core Components/Risk Factors/Patient Goals at Discharge (Final Review):    ITP Comments:  ITP Comments     Row Name 04/12/23 1315 04/18/23 1717         ITP Comments Initial phone call completed. Diagnosis can be found in [facility] 6/4. EP Orientation scheduled for Monday 7/8 at 3pm. Completed and gym orientation. Initial ITP created and sent for review to Dr. Bethann Punches, Medical Director.               Comments: initial ITP

## 2023-04-18 NOTE — Patient Instructions (Signed)
Patient Instructions  Patient Details  Name: Michael Mckenzie MRN: [id number] Date of Birth: 1961/01/02 Referring Provider:  Mick Sell, MD  Below are your personal goals for exercise, nutrition, and risk factors. Our goal is to help you stay on track towards obtaining and maintaining these goals. We will be discussing your progress on these goals with you throughout the program.  Initial Exercise Prescription:  Initial Exercise Prescription - 04/18/23 1700       Date of Initial Exercise RX and Referring Provider   Date 04/18/23    Referring Provider Dr. Sampson Goon      Oxygen   Maintain Oxygen Saturation 88% or higher      Treadmill   MPH 3    Grade 1    Minutes 15    METs 3.71      Recumbant Elliptical   Level 2    RPM 50    Minutes 15    METs 4.23      REL-XR   Level 2    Speed 50    Minutes 15    METs 4.23      Prescription Details   Frequency (times per week) 3    Duration Progress to 30 minutes of continuous aerobic without signs/symptoms of physical distress      Intensity   THRR 40-80% of Max Heartrate 113-142    Ratings of Perceived Exertion 11-13    Perceived Dyspnea 0-4      Progression   Progression Continue to progress workloads to maintain intensity without signs/symptoms of physical distress.      Resistance Training   Training Prescription Yes    Weight 3    Reps 10-15             Exercise Goals: Frequency: Be able to perform aerobic exercise two to three times per week in program working toward 2-5 days per week of home exercise.  Intensity: Work with a perceived exertion of 11 (fairly light) - 15 (hard) while following your exercise prescription.  We will make changes to your prescription with you as you progress through the program.   Duration: Be able to do 30 to 45 minutes of continuous aerobic exercise in addition to a 5 minute warm-up and a 5 minute cool-down routine.   Nutrition Goals: Your personal nutrition goals will  be established when you do your nutrition analysis with the dietician.  The following are general nutrition guidelines to follow: Cholesterol < 200mg /day Sodium < 1500mg /day Fiber: Men over 50 yrs - 30 grams per day  Personal Goals:  Personal Goals and Risk Factors at Admission - 04/18/23 1751       Core Components/Risk Factors/Patient Goals on Admission    Weight Management Yes    Intervention Weight Management: Develop a combined nutrition and exercise program designed to reach desired caloric intake, while maintaining appropriate intake of nutrient and fiber, sodium and fats, and appropriate energy expenditure required for the weight goal.;Weight Management: Provide education and appropriate resources to help participant work on and attain dietary goals.    Admit Weight 181 lb 11.2 oz (82.4 kg)    Goal Weight: Short Term 180 lb (81.6 kg)    Goal Weight: Long Term 180 lb (81.6 kg)    Expected Outcomes Short Term: Continue to assess and modify interventions until short term weight is achieved;Long Term: Adherence to nutrition and physical activity/exercise program aimed toward attainment of established weight goal;Weight Maintenance: Understanding of the daily nutrition guidelines, which includes  25-35% calories from fat, 7% or less cal from saturated fats, less than 200mg  cholesterol, less than 1.5gm of sodium, & 5 or more servings of fruits and vegetables daily;Understanding recommendations for meals to include 15-35% energy as protein, 25-35% energy from fat, 35-60% energy from carbohydrates, less than 200mg  of dietary cholesterol, 20-35 gm of total fiber daily;Understanding of distribution of calorie intake throughout the day with the consumption of 4-5 meals/snacks    Lipids Yes    Intervention Provide education and support for participant on nutrition & aerobic/resistive exercise along with prescribed medications to achieve LDL 70mg , HDL >40mg .    Expected Outcomes Short Term: Participant  states understanding of desired cholesterol values and is compliant with medications prescribed. Participant is following exercise prescription and nutrition guidelines.;Long Term: Cholesterol controlled with medications as prescribed, with individualized exercise RX and with personalized nutrition plan. Value goals: LDL < 70mg , HDL > 40 mg.             Tobacco Use Initial Evaluation: Social History   Tobacco Use  Smoking Status Never  Smokeless Tobacco Former   Quit date: 08/26/2011    Exercise Goals and Review:  Exercise Goals     Row Name 04/18/23 1748             Exercise Goals   Increase Physical Activity Yes       Intervention Provide advice, education, support and counseling about physical activity/exercise needs.;Develop an individualized exercise prescription for aerobic and resistive training based on initial evaluation findings, risk stratification, comorbidities and participant's personal goals.       Expected Outcomes Short Term: Attend rehab on a regular basis to increase amount of physical activity.;Long Term: Add in home exercise to make exercise part of routine and to increase amount of physical activity.;Long Term: Exercising regularly at least 3-5 days a week.       Increase Strength and Stamina Yes       Intervention Provide advice, education, support and counseling about physical activity/exercise needs.;Develop an individualized exercise prescription for aerobic and resistive training based on initial evaluation findings, risk stratification, comorbidities and participant's personal goals.       Expected Outcomes Short Term: Increase workloads from initial exercise prescription for resistance, speed, and METs.;Short Term: Perform resistance training exercises routinely during rehab and add in resistance training at home;Long Term: Improve cardiorespiratory fitness, muscular endurance and strength as measured by increased METs and functional capacity ( )        Able to understand and use rate of perceived exertion (RPE) scale Yes       Intervention Provide education and explanation on how to use RPE scale       Expected Outcomes Short Term: Able to use RPE daily in rehab to express subjective intensity level;Long Term:  Able to use RPE to guide intensity level when exercising independently       Able to understand and use Dyspnea scale Yes       Intervention Provide education and explanation on how to use Dyspnea scale       Expected Outcomes Short Term: Able to use Dyspnea scale daily in rehab to express subjective sense of shortness of breath during exertion;Long Term: Able to use Dyspnea scale to guide intensity level when exercising independently       Knowledge and understanding of Target Heart Rate Range (THRR) Yes       Intervention Provide education and explanation of THRR including how the numbers were predicted and where they  are located for reference       Expected Outcomes Short Term: Able to state/look up THRR;Long Term: Able to use THRR to govern intensity when exercising independently;Short Term: Able to use daily as guideline for intensity in rehab       Able to check pulse independently Yes       Intervention Provide education and demonstration on how to check pulse in carotid and radial arteries.;Review the importance of being able to check your own pulse for safety during independent exercise       Expected Outcomes Short Term: Able to explain why pulse checking is important during independent exercise;Long Term: Able to check pulse independently and accurately       Understanding of Exercise Prescription Yes       Intervention Provide education, explanation, and written materials on patient's individual exercise prescription       Expected Outcomes Short Term: Able to explain program exercise prescription;Long Term: Able to explain home exercise prescription to exercise independently                Copy of goals given to  participant.

## 2023-04-19 ENCOUNTER — Encounter: Payer: Self-pay | Admitting: *Deleted

## 2023-04-19 DIAGNOSIS — Z952 Presence of prosthetic heart valve: Secondary | ICD-10-CM

## 2023-04-19 NOTE — Progress Notes (Signed)
Cardiac Individual Treatment Plan  Patient Details  Name: Michael Mckenzie MRN: [id number] Date of Birth: February 07, 1961 Referring Provider:   Flowsheet Row Cardiac Rehab from 04/18/2023 in [facility] Cardiac and Pulmonary Rehab  Referring Provider Dr. Sampson Goon       Initial Encounter Date:  Flowsheet Row Cardiac Rehab from 04/18/2023 in [facility] Cardiac and Pulmonary Rehab  Date 04/18/23       Visit Diagnosis: S/P mitral valve replacement  Patient's Home Medications on Admission:  Current Outpatient Medications:    amiodarone (PACERONE) 200 MG tablet, Take 200 mg by mouth daily., Disp: , Rfl:    aspirin 81 MG chewable tablet, Chew 81 mg by mouth daily., Disp: , Rfl:    atorvastatin (LIPITOR) 40 MG tablet, Take 40 mg by mouth daily., Disp: , Rfl:    Calcium Citrate-Vitamin D (CALCIUM CITRATE + D PO), Take by mouth daily., Disp: , Rfl:    cyclobenzaprine (FLEXERIL) 5 MG tablet, Take 1-2 tablets 3 times daily as needed, Disp: 20 tablet, Rfl: 0   furosemide (LASIX) 20 MG tablet, Take 20 mg by mouth daily., Disp: , Rfl:    GLUCOSAMINE-CHONDROITIN PO, Take by mouth daily., Disp: , Rfl:    magnesium oxide (MAG-OX) 400 MG tablet, Take 400 mg by mouth daily., Disp: , Rfl:    Multiple Vitamin (MULTIVITAMIN ADULT PO), Take 1 tablet by mouth daily., Disp: , Rfl:    Omega-3 Fatty Acids (FISH OIL PO), Take by mouth., Disp: , Rfl:    Potassium 99 MG TABS, Take 1 tablet by mouth daily., Disp: , Rfl:   Past Medical History: Past Medical History:  Diagnosis Date   Sleep apnea     Tobacco Use: Social History   Tobacco Use  Smoking Status Never  Smokeless Tobacco Former   Quit date: 08/26/2011    Labs: Review Flowsheet        No data to display           Exercise Target Goals: Exercise Program Goal: Individual exercise prescription set using results from initial 6 min walk test and THRR while considering  patient's activity barriers and safety.   Exercise Prescription Goal: Initial exercise  prescription builds to 30-45 minutes a day of aerobic activity, 2-3 days per week.  Home exercise guidelines will be given to patient during program as part of exercise prescription that the participant will acknowledge.   Education: Aerobic Exercise: - Group verbal and visual presentation on the components of exercise prescription. Introduces F.I.T.T principle from ACSM for exercise prescriptions.  Reviews F.I.T.T. principles of aerobic exercise including progression. Written material given at graduation.   Education: Resistance Exercise: - Group verbal and visual presentation on the components of exercise prescription. Introduces F.I.T.T principle from ACSM for exercise prescriptions  Reviews F.I.T.T. principles of resistance exercise including progression. Written material given at graduation.    Education: Exercise & Equipment Safety: - Individual verbal instruction and demonstration of equipment use and safety with use of the equipment. Flowsheet Row Cardiac Rehab from 04/18/2023 in [facility] Cardiac and Pulmonary Rehab  Date 04/18/23  Educator [facility]  Instruction Review Code 1- Verbalizes Understanding       Education: Exercise Physiology & General Exercise Guidelines: - Group verbal and written instruction with models to review the exercise physiology of the cardiovascular system and associated critical values. Provides general exercise guidelines with specific guidelines to those with heart or lung disease.    Education: Flexibility, Balance, Mind/Body Relaxation: - Group verbal and visual presentation with interactive activity  on the components of exercise prescription. Introduces F.I.T.T principle from ACSM for exercise prescriptions. Reviews F.I.T.T. principles of flexibility and balance exercise training including progression. Also discusses the mind body connection.  Reviews various relaxation techniques to help reduce and manage stress (i.e. Deep breathing, progressive muscle relaxation,  and visualization). Balance handout provided to take home. Written material given at graduation.   Activity Barriers & Risk Stratification:  Activity Barriers & Cardiac Risk Stratification - 04/18/23 1718       Activity Barriers & Cardiac Risk Stratification   Activity Barriers Joint Problems             6 Minute Walk:  6 Minute Walk     Row Name 04/18/23 1717         6 Minute Walk   Phase Initial     Distance 1540 feet     Walk Time 6 minutes     # of Rest Breaks 0     MPH 2.92     METS 4.23     RPE 10     Perceived Dyspnea  0     VO2 Peak 14.8     Symptoms No     Resting HR 85 bpm     Resting BP 132/80     Resting Oxygen Saturation  97 %     Exercise Oxygen Saturation  during 6 min walk 99 %     Max Ex. HR 119 bpm     Max Ex. BP 162/82     2 Minute Post BP 132/80              Oxygen Initial Assessment:   Oxygen Re-Evaluation:   Oxygen Discharge (Final Oxygen Re-Evaluation):   Initial Exercise Prescription:  Initial Exercise Prescription - 04/18/23 1700       Date of Initial Exercise RX and Referring Provider   Date 04/18/23    Referring Provider Dr. Sampson Goon      Oxygen   Maintain Oxygen Saturation 88% or higher      Treadmill   MPH 3    Grade 1    Minutes 15    METs 3.71      Recumbant Elliptical   Level 2    RPM 50    Minutes 15    METs 4.23      REL-XR   Level 2    Speed 50    Minutes 15    METs 4.23      Prescription Details   Frequency (times per week) 3    Duration Progress to 30 minutes of continuous aerobic without signs/symptoms of physical distress      Intensity   THRR 40-80% of Max Heartrate 113-142    Ratings of Perceived Exertion 11-13    Perceived Dyspnea 0-4      Progression   Progression Continue to progress workloads to maintain intensity without signs/symptoms of physical distress.      Resistance Training   Training Prescription Yes    Weight 3    Reps 10-15             Perform  Capillary Blood Glucose checks as needed.  Exercise Prescription Changes:   Exercise Prescription Changes     Row Name 04/18/23 1700             Response to Exercise   Blood Pressure (Admit) 132/80       Blood Pressure (Exercise) 162/82       Blood Pressure (Exit) 132/82  Heart Rate (Admit) 85 bpm       Heart Rate (Exercise) 119 bpm       Heart Rate (Exit) 99 bpm       Oxygen Saturation (Admit) 97 %       Oxygen Saturation (Exercise) 99 %       Oxygen Saturation (Exit) 99 %       Rating of Perceived Exertion (Exercise) 10       Perceived Dyspnea (Exercise) 0       Symptoms none       Comments 6 MWT results                Exercise Comments:   Exercise Goals and Review:   Exercise Goals     Row Name 04/18/23 1748             Exercise Goals   Increase Physical Activity Yes       Intervention Provide advice, education, support and counseling about physical activity/exercise needs.;Develop an individualized exercise prescription for aerobic and resistive training based on initial evaluation findings, risk stratification, comorbidities and participant's personal goals.       Expected Outcomes Short Term: Attend rehab on a regular basis to increase amount of physical activity.;Long Term: Add in home exercise to make exercise part of routine and to increase amount of physical activity.;Long Term: Exercising regularly at least 3-5 days a week.       Increase Strength and Stamina Yes       Intervention Provide advice, education, support and counseling about physical activity/exercise needs.;Develop an individualized exercise prescription for aerobic and resistive training based on initial evaluation findings, risk stratification, comorbidities and participant's personal goals.       Expected Outcomes Short Term: Increase workloads from initial exercise prescription for resistance, speed, and METs.;Short Term: Perform resistance training exercises routinely during rehab  and add in resistance training at home;Long Term: Improve cardiorespiratory fitness, muscular endurance and strength as measured by increased METs and functional capacity ( )       Able to understand and use rate of perceived exertion (RPE) scale Yes       Intervention Provide education and explanation on how to use RPE scale       Expected Outcomes Short Term: Able to use RPE daily in rehab to express subjective intensity level;Long Term:  Able to use RPE to guide intensity level when exercising independently       Able to understand and use Dyspnea scale Yes       Intervention Provide education and explanation on how to use Dyspnea scale       Expected Outcomes Short Term: Able to use Dyspnea scale daily in rehab to express subjective sense of shortness of breath during exertion;Long Term: Able to use Dyspnea scale to guide intensity level when exercising independently       Knowledge and understanding of Target Heart Rate Range (THRR) Yes       Intervention Provide education and explanation of THRR including how the numbers were predicted and where they are located for reference       Expected Outcomes Short Term: Able to state/look up THRR;Long Term: Able to use THRR to govern intensity when exercising independently;Short Term: Able to use daily as guideline for intensity in rehab       Able to check pulse independently Yes       Intervention Provide education and demonstration on how to check pulse in carotid and radial arteries.;Review the  importance of being able to check your own pulse for safety during independent exercise       Expected Outcomes Short Term: Able to explain why pulse checking is important during independent exercise;Long Term: Able to check pulse independently and accurately       Understanding of Exercise Prescription Yes       Intervention Provide education, explanation, and written materials on patient's individual exercise prescription       Expected Outcomes Short  Term: Able to explain program exercise prescription;Long Term: Able to explain home exercise prescription to exercise independently                Exercise Goals Re-Evaluation :   Discharge Exercise Prescription (Final Exercise Prescription Changes):  Exercise Prescription Changes - 04/18/23 1700       Response to Exercise   Blood Pressure (Admit) 132/80    Blood Pressure (Exercise) 162/82    Blood Pressure (Exit) 132/82    Heart Rate (Admit) 85 bpm    Heart Rate (Exercise) 119 bpm    Heart Rate (Exit) 99 bpm    Oxygen Saturation (Admit) 97 %    Oxygen Saturation (Exercise) 99 %    Oxygen Saturation (Exit) 99 %    Rating of Perceived Exertion (Exercise) 10    Perceived Dyspnea (Exercise) 0    Symptoms none    Comments 6 MWT results             Nutrition:  Target Goals: Understanding of nutrition guidelines, daily intake of sodium 1500mg , cholesterol 200mg , calories 30% from fat and 7% or less from saturated fats, daily to have 5 or more servings of fruits and vegetables.  Education: All About Nutrition: -Group instruction provided by verbal, written material, interactive activities, discussions, models, and posters to present general guidelines for heart healthy nutrition including fat, fiber, MyPlate, the role of sodium in heart healthy nutrition, utilization of the nutrition label, and utilization of this knowledge for meal planning. Follow up email sent as well. Written material given at graduation. Flowsheet Row Cardiac Rehab from 04/18/2023 in [facility] Cardiac and Pulmonary Rehab  Education need identified 04/18/23       Biometrics:  Pre Biometrics - 04/18/23 1748       Pre Biometrics   Height 5\' 9"  (1.753 m)    Weight 181 lb 11.2 oz (82.4 kg)    Waist Circumference 38 inches    Hip Circumference 42 inches    Waist to Hip Ratio 0.9 %    BMI (Calculated) 26.82    Single Leg Stand 21.22 seconds              Nutrition Therapy Plan and Nutrition  Goals:  Nutrition Therapy & Goals - 04/18/23 1753       Intervention Plan   Intervention Prescribe, educate and counsel regarding individualized specific dietary modifications aiming towards targeted core components such as weight, hypertension, lipid management, diabetes, heart failure and other comorbidities.    Expected Outcomes Short Term Goal: Understand basic principles of dietary content, such as calories, fat, sodium, cholesterol and nutrients.;Short Term Goal: A plan has been developed with personal nutrition goals set during dietitian appointment.;Long Term Goal: Adherence to prescribed nutrition plan.             Nutrition Assessments:  MEDIFICTS Score Key: ?70 Need to make dietary changes  40-70 Heart Healthy Diet ? 40 Therapeutic Level Cholesterol Diet  Flowsheet Row Cardiac Rehab from 04/18/2023 in [facility] Cardiac and Pulmonary Rehab  Picture Your Plate Total Score on Admission 58      Picture Your Plate Scores: <16 Unhealthy dietary pattern with much room for improvement. 41-50 Dietary pattern unlikely to meet recommendations for good health and room for improvement. 51-60 More healthful dietary pattern, with some room for improvement.  >60 Healthy dietary pattern, although there may be some specific behaviors that could be improved.    Nutrition Goals Re-Evaluation:   Nutrition Goals Discharge (Final Nutrition Goals Re-Evaluation):   Psychosocial: Target Goals: Acknowledge presence or absence of significant depression and/or stress, maximize coping skills, provide positive support system. Participant is able to verbalize types and ability to use techniques and skills needed for reducing stress and depression.   Education: Stress, Anxiety, and Depression - Group verbal and visual presentation to define topics covered.  Reviews how body is impacted by stress, anxiety, and depression.  Also discusses healthy ways to reduce stress and to treat/manage anxiety and  depression.  Written material given at graduation.   Education: Sleep Hygiene -Provides group verbal and written instruction about how sleep can affect your health.  Define sleep hygiene, discuss sleep cycles and impact of sleep habits. Review good sleep hygiene tips.    Initial Review & Psychosocial Screening:  Initial Psych Review & Screening - 04/12/23 1318       Initial Review   Current issues with Current Stress Concerns    Comments healing process, off routine      Family Dynamics   Good Support System? Yes   wife, daughter     Barriers   Psychosocial barriers to participate in program There are no identifiable barriers or psychosocial needs.;The patient should benefit from training in stress management and relaxation.      Screening Interventions   Interventions Encouraged to exercise;Provide feedback about the scores to participant;To provide support and resources with identified psychosocial needs    Expected Outcomes Short Term goal: Utilizing psychosocial counselor, staff and physician to assist with identification of specific Stressors or current issues interfering with healing process. Setting desired goal for each stressor or current issue identified.;Long Term Goal: Stressors or current issues are controlled or eliminated.;Short Term goal: Identification and review with participant of any Quality of Life or Depression concerns found by scoring the questionnaire.;Long Term goal: The participant improves quality of Life and PHQ9 Scores as seen by post scores and/or verbalization of changes             Quality of Life Scores:   Quality of Life - 04/18/23 1749       Quality of Life   Select Quality of Life      Quality of Life Scores   Health/Function Pre 23.18 %    Socioeconomic Pre 24.57 %    Psych/Spiritual Pre 22 %    Family Pre 18.13 %    GLOBAL Pre 22.59 %            Scores of 19 and below usually indicate a poorer quality of life in these areas.  A  difference of  2-3 points is a clinically meaningful difference.  A difference of 2-3 points in the total score of the Quality of Life Index has been associated with significant improvement in overall quality of life, self-image, physical symptoms, and general health in studies assessing change in quality of life.  PHQ-9: Review Flowsheet       04/18/2023  Depression screen PHQ 2/9  Decreased Interest 1  Down, Depressed, Hopeless 0  PHQ - 2  Score 1  Altered sleeping 0  Tired, decreased energy 1  Change in appetite 0  Feeling bad or failure about yourself  0  Trouble concentrating 2  Moving slowly or fidgety/restless 0  Suicidal thoughts 0  PHQ-9 Score 4  Difficult doing work/chores Not difficult at all   Interpretation of Total Score  Total Score Depression Severity:  1-4 = Minimal depression, 5-9 = Mild depression, 10-14 = Moderate depression, 15-19 = Moderately severe depression, 20-27 = Severe depression   Psychosocial Evaluation and Intervention:  Psychosocial Evaluation - 04/12/23 1326       Psychosocial Evaluation & Interventions   Interventions Encouraged to exercise with the program and follow exercise prescription    Comments Tobi is 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 He originally went to his doctor for a cough and they found the heart murmer, so needing heart surgery was a surprise. He is currently on medical leave from work with plans to return when his doctor clears him. His wife and daughter are his main support system. He mentions his wife does have other health concerns, but doing well currently. He feels like he is healing well from surgery. 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 He is hopeful with time the nerve issues recover and he is working with his dentist regarding the dental pain. He is looking forward to  coming to the program to work on his exercise routine and education    Expected Outcomes Short: attend cardiac rehab for education and exercise. Long: develop and maintain positive self care habits.    Continue Psychosocial Services  Follow up required by staff             Psychosocial Re-Evaluation:   Psychosocial Discharge (Final Psychosocial Re-Evaluation):   Vocational Rehabilitation: Provide vocational rehab assistance to qualifying candidates.   Vocational Rehab Evaluation & Intervention:  Vocational Rehab - 04/12/23 1309       Initial Vocational Rehab Evaluation & Intervention   Assessment shows need for Vocational Rehabilitation No             Education: Education Goals: Education classes will be provided on a variety of topics geared toward better understanding of heart health and risk factor modification. Participant will state understanding/return demonstration of topics presented as noted by education test scores.  Learning Barriers/Preferences:  Learning Barriers/Preferences - 04/12/23 1308       Learning Barriers/Preferences   Learning Barriers None    Learning Preferences None             General Cardiac Education Topics:  AED/CPR: - Group verbal and written instruction with the use of models to demonstrate the basic use of the AED with the basic ABC's of resuscitation.   Anatomy and Cardiac Procedures: - Group verbal and visual presentation and models provide information about basic cardiac anatomy and function. Reviews the testing methods done to diagnose heart disease and the outcomes of the test results. Describes the treatment choices: Medical Management, Angioplasty, or Coronary Bypass Surgery for treating various heart conditions including Myocardial Infarction, Angina, Valve Disease, and Cardiac Arrhythmias.  Written material given at graduation. Flowsheet Row Cardiac Rehab from 04/18/2023 in [facility] Cardiac and Pulmonary Rehab  Education  need identified 04/18/23       Medication Safety: - Group verbal and visual instruction to review commonly prescribed medications for  heart and lung disease. Reviews the medication, class of the drug, and side effects. Includes the steps to properly store meds and maintain the prescription regimen.  Written material given at graduation.   Intimacy: - Group verbal instruction through game format to discuss how heart and lung disease can affect sexual intimacy. Written material given at graduation..   Know Your Numbers and Heart Failure: - Group verbal and visual instruction to discuss disease risk factors for cardiac and pulmonary disease and treatment options.  Reviews associated critical values for Overweight/Obesity, Hypertension, Cholesterol, and Diabetes.  Discusses basics of heart failure: signs/symptoms and treatments.  Introduces Heart Failure Zone chart for action plan for heart failure.  Written material given at graduation.   Infection Prevention: - Provides verbal and written material to individual with discussion of infection control including proper hand washing and proper equipment cleaning during exercise session. Flowsheet Row Cardiac Rehab from 04/18/2023 in [facility] Cardiac and Pulmonary Rehab  Date 04/18/23  Educator [facility]  Instruction Review Code 1- Verbalizes Understanding       Falls Prevention: - Provides verbal and written material to individual with discussion of falls prevention and safety. Flowsheet Row Cardiac Rehab from 04/18/2023 in [facility] Cardiac and Pulmonary Rehab  Date 04/18/23  Educator [facility]  Instruction Review Code 1- Verbalizes Understanding       Other: -Provides group and verbal instruction on various topics (see comments)   Knowledge Questionnaire Score:  Knowledge Questionnaire Score - 04/18/23 1749       Knowledge Questionnaire Score   Pre Score 21/26             Core Components/Risk Factors/Patient Goals at Admission:  Personal Goals  and Risk Factors at Admission - 04/18/23 1751       Core Components/Risk Factors/Patient Goals on Admission    Weight Management Yes    Intervention Weight Management: Develop a combined nutrition and exercise program designed to reach desired caloric intake, while maintaining appropriate intake of nutrient and fiber, sodium and fats, and appropriate energy expenditure required for the weight goal.;Weight Management: Provide education and appropriate resources to help participant work on and attain dietary goals.    Admit Weight 181 lb 11.2 oz (82.4 kg)    Goal Weight: Short Term 180 lb (81.6 kg)    Goal Weight: Long Term 180 lb (81.6 kg)    Expected Outcomes Short Term: Continue to assess and modify interventions until short term weight is achieved;Long Term: Adherence to nutrition and physical activity/exercise program aimed toward attainment of established weight goal;Weight Maintenance: Understanding of the daily nutrition guidelines, which includes 25-35% calories from fat, 7% or less cal from saturated fats, less than 200mg  cholesterol, less than 1.5gm of sodium, & 5 or more servings of fruits and vegetables daily;Understanding recommendations for meals to include 15-35% energy as protein, 25-35% energy from fat, 35-60% energy from carbohydrates, less than 200mg  of dietary cholesterol, 20-35 gm of total fiber daily;Understanding of distribution of calorie intake throughout the day with the consumption of 4-5 meals/snacks    Lipids Yes    Intervention Provide education and support for participant on nutrition & aerobic/resistive exercise along with prescribed medications to achieve LDL 70mg , HDL >40mg .    Expected Outcomes Short Term: Participant states understanding of desired cholesterol values and is compliant with medications prescribed. Participant is following exercise prescription and nutrition guidelines.;Long Term: Cholesterol controlled with medications as prescribed, with individualized  exercise RX and with personalized nutrition plan. Value goals: LDL < 70mg , HDL >  40 mg.             Education:Diabetes - Individual verbal and written instruction to review signs/symptoms of diabetes, desired ranges of glucose level fasting, after meals and with exercise. Acknowledge that pre and post exercise glucose checks will be done for 3 sessions at entry of program.   Core Components/Risk Factors/Patient Goals Review:    Core Components/Risk Factors/Patient Goals at Discharge (Final Review):    ITP Comments:  ITP Comments     Row Name 04/12/23 1315 04/18/23 1717 04/19/23 1353       ITP Comments Initial phone call completed. Diagnosis can be found in [facility] 6/4. EP Orientation scheduled for Monday 7/8 at 3pm. Completed and gym orientation. Initial ITP created and sent for review to Dr. Bethann Punches, Medical Director. 30 Day review completed. Medical Director ITP review done, changes made as directed, and signed approval by Medical Director.    new to program              Comments:

## 2023-04-22 ENCOUNTER — Encounter: Payer: 59 | Admitting: *Deleted

## 2023-04-22 DIAGNOSIS — Z48812 Encounter for surgical aftercare following surgery on the circulatory system: Secondary | ICD-10-CM | POA: Diagnosis not present

## 2023-04-22 DIAGNOSIS — Z952 Presence of prosthetic heart valve: Secondary | ICD-10-CM

## 2023-04-22 NOTE — Progress Notes (Signed)
Daily Session Note  Patient Details  Name: Michael Mckenzie MRN: [id number] Date of Birth: 1961/07/16 Referring Provider:   Flowsheet Row Cardiac Rehab from 04/18/2023 in [facility] Cardiac and Pulmonary Rehab  Referring Provider Dr. Sampson Goon       Encounter Date: 04/22/2023  Check In:  Session Check In - 04/22/23 0820       Check-In   Supervising physician immediately available to respond to emergencies See telemetry face sheet for immediately available ER MD    Location [facility]-Cardiac & Pulmonary Rehab    Staff Present Cora Collum, RN, BSN, CCRP;Kristen Coble, RN,BC,MSN;Joseph [place], [place]    Virtual Visit No    Medication changes reported     Yes    Comments stopped Amiodarone per MD instruction    Fall or balance concerns reported    No    Warm-up and Cool-down Performed on first and last piece of equipment    Resistance Training Performed Yes    VAD Patient? No    PAD/SET Patient? No      Pain Assessment   Currently in Pain? No/denies                Social History   Tobacco Use  Smoking Status Never  Smokeless Tobacco Former   Quit date: 08/26/2011    Goals Met:  Exercise tolerated well Personal goals reviewed No report of concerns or symptoms today  Goals Unmet:  Not Applicable  Comments: First full day of exercise!  Patient was oriented to gym and equipment including functions, settings, policies, and procedures.  Patient's individual exercise prescription and treatment plan were reviewed.  All starting workloads were established based on the results of the 6 minute walk test done at initial orientation visit.  The plan for exercise progression was also introduced and progression will be customized based on patient's performance and goals.    Dr. Bethann Punches is Medical Director for [facility] Cardiac Rehabilitation.  Dr. Vida Rigger is Medical Director for [facility] Pulmonary Rehabilitation.

## 2023-04-25 ENCOUNTER — Encounter: Payer: 59 | Admitting: *Deleted

## 2023-04-25 DIAGNOSIS — Z48812 Encounter for surgical aftercare following surgery on the circulatory system: Secondary | ICD-10-CM | POA: Diagnosis not present

## 2023-04-25 DIAGNOSIS — Z952 Presence of prosthetic heart valve: Secondary | ICD-10-CM

## 2023-04-25 NOTE — Progress Notes (Signed)
Daily Session Note  Patient Details  Name: Michael Mckenzie MRN: [id number] Date of Birth: April 17, 1961 Referring Provider:   Flowsheet Row Cardiac Rehab from 04/18/2023 in [facility] Cardiac and Pulmonary Rehab  Referring Provider Dr. Sampson Goon       Encounter Date: 04/25/2023  Check In:  Session Check In - 04/25/23 0808       Check-In   Supervising physician immediately available to respond to emergencies See telemetry face sheet for immediately available ER MD    Location [facility]-Cardiac & Pulmonary Rehab    Staff Present Lanny Hurst, RN, ADN;Joseph Reino Kent, RCP,RRT,BSRT;Kelly Madilyn Fireman, BS, ACSM CEP, Exercise Physiologist    Virtual Visit No    Medication changes reported     No    Fall or balance concerns reported    No    Warm-up and Cool-down Performed on first and last piece of equipment    Resistance Training Performed Yes    VAD Patient? No    PAD/SET Patient? No      Pain Assessment   Currently in Pain? No/denies                Social History   Tobacco Use  Smoking Status Never  Smokeless Tobacco Former   Quit date: 08/26/2011    Goals Met:  Independence with exercise equipment Exercise tolerated well No report of concerns or symptoms today Strength training completed today  Goals Unmet:  Not Applicable  Comments: Pt able to follow exercise prescription today without complaint.  Will continue to monitor for progression.    Dr. Bethann Punches is Medical Director for [facility] Cardiac Rehabilitation.  Dr. Vida Rigger is Medical Director for [facility] Pulmonary Rehabilitation.

## 2023-04-25 NOTE — Progress Notes (Signed)
Assessment:  Accessibility to food no concerns Digestive issues/concerns no known food allergies, no digestive concerns  24-hours Recall: B: scrambled egg, tater tots, bacon wrapped jalapeno  Snack: cottage cheese, cantaloupe  L: 2 pieces of bread with ham Snack: pound cake, ice cream  D: frozen shrimp, breaded, spring rolls   Beverages diet orange soda, water (16oz), coffee, tea Alcohol 2-3 times per week Caffeine coffee, tea  Intake Patterns inconsistent due to busy schedule, usually gets a decent breakfast  Education r/t nutrition plan Patient has good family support, his wife is also following nutrition guidance for cancer recovery. Neither eat large portions and both like fruits and veggies. They have been staying away from carb heavy foods and watching their sugary foods. All good choices, encouraged them to continue to be mindful of food choices and allow for moderation of comfort foods from time to time. Educated on [employer] handout, types of fats, sources, and how to read food labels. Reviewed sodium content of some of his favorite foods, frozen meals, and snacks. He is drinking 1 bottle of water per day. Talked about ways to increase, such as bringing a bottle with him on his commutes to work. He reports snacking during his drive home after long shifts. His routine is inconsistent with busy work and life schedules. 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 Set follow up appointment in ~66month to work together on goals after going back to work and switching to day shift.    Goal 1: Drink 2 bottles of water per day (bring water on commute) Goal 2: Pair a protein and carb at meals Goal 3: Be mindful of routine and consistency

## 2023-04-27 ENCOUNTER — Encounter: Payer: 59 | Admitting: *Deleted

## 2023-04-27 DIAGNOSIS — Z952 Presence of prosthetic heart valve: Secondary | ICD-10-CM

## 2023-04-27 DIAGNOSIS — Z48812 Encounter for surgical aftercare following surgery on the circulatory system: Secondary | ICD-10-CM | POA: Diagnosis not present

## 2023-04-27 NOTE — Progress Notes (Signed)
Daily Session Note  Patient Details  Name: Michael Mckenzie MRN: [id number] Date of Birth: 04-Jun-1961 Referring Provider:   Flowsheet Row Cardiac Rehab from 04/18/2023 in [facility] Cardiac and Pulmonary Rehab  Referring Provider Dr. Sampson Goon       Encounter Date: 04/27/2023  Check In:  Session Check In - 04/27/23 0827       Check-In   Supervising physician immediately available to respond to emergencies See telemetry face sheet for immediately available ER MD    Location [facility]-Cardiac & Pulmonary Rehab    Staff Present Lanny Hurst, RN, ADN;Joseph [place], RCP,RRT,BSRT;Other   Girtha Rm, MS   Virtual Visit No    Medication changes reported     No    Fall or balance concerns reported    No    Warm-up and Cool-down Performed on first and last piece of equipment    Resistance Training Performed Yes    VAD Patient? No    PAD/SET Patient? No      Pain Assessment   Currently in Pain? No/denies                Social History   Tobacco Use  Smoking Status Never  Smokeless Tobacco Former   Quit date: 08/26/2011    Goals Met:  Independence with exercise equipment Exercise tolerated well No report of concerns or symptoms today Strength training completed today  Goals Unmet:  Not Applicable  Comments: Pt able to follow exercise prescription today without complaint.  Will continue to monitor for progression.    Dr. Bethann Punches is Medical Director for [facility] Cardiac Rehabilitation.  Dr. Vida Rigger is Medical Director for [facility] Pulmonary Rehabilitation.

## 2023-05-02 ENCOUNTER — Encounter: Payer: 59 | Admitting: *Deleted

## 2023-05-02 DIAGNOSIS — Z48812 Encounter for surgical aftercare following surgery on the circulatory system: Secondary | ICD-10-CM | POA: Diagnosis not present

## 2023-05-02 DIAGNOSIS — Z952 Presence of prosthetic heart valve: Secondary | ICD-10-CM

## 2023-05-02 NOTE — Progress Notes (Signed)
Daily Session Note  Patient Details  Name: Michael Mckenzie MRN: [id number] Date of Birth: April 13, 1961 Referring Provider:   Flowsheet Row Cardiac Rehab from 04/18/2023 in [facility] Cardiac and Pulmonary Rehab  Referring Provider Dr. Sampson Goon       Encounter Date: 05/02/2023  Check In:  Session Check In - 05/02/23 0803       Check-In   Supervising physician immediately available to respond to emergencies See telemetry face sheet for immediately available ER MD    Location [facility]-Cardiac & Pulmonary Rehab    Staff Present Lanny Hurst, RN, ADN;Joseph [place], RCP,RRT,BSRT;Cora Collum, RN, BSN, CCRP    Virtual Visit No    Medication changes reported     No    Fall or balance concerns reported    No    Warm-up and Cool-down Performed on first and last piece of equipment    Resistance Training Performed Yes    VAD Patient? No    PAD/SET Patient? No      Pain Assessment   Currently in Pain? No/denies                Social History   Tobacco Use  Smoking Status Never  Smokeless Tobacco Former   Quit date: 08/26/2011    Goals Met:  Independence with exercise equipment Exercise tolerated well No report of concerns or symptoms today Strength training completed today  Goals Unmet:  Not Applicable  Comments: Pt able to follow exercise prescription today without complaint.  Will continue to monitor for progression.    Dr. Bethann Punches is Medical Director for [facility] Cardiac Rehabilitation.  Dr. Vida Rigger is Medical Director for [facility] Pulmonary Rehabilitation.

## 2023-05-04 ENCOUNTER — Encounter: Payer: 59 | Admitting: *Deleted

## 2023-05-04 DIAGNOSIS — Z952 Presence of prosthetic heart valve: Secondary | ICD-10-CM

## 2023-05-04 DIAGNOSIS — Z48812 Encounter for surgical aftercare following surgery on the circulatory system: Secondary | ICD-10-CM | POA: Diagnosis not present

## 2023-05-04 NOTE — Progress Notes (Signed)
Daily Session Note  Patient Details  Name: Michael Mckenzie MRN: [id number] Date of Birth: 02/10/1961 Referring Provider:   Flowsheet Row Cardiac Rehab from 04/18/2023 in [facility] Cardiac and Pulmonary Rehab  Referring Provider Dr. Sampson Goon       Encounter Date: 05/04/2023  Check In:  Session Check In - 05/04/23 0801       Check-In   Supervising physician immediately available to respond to emergencies See telemetry face sheet for immediately available ER MD    Location [facility]-Cardiac & Pulmonary Rehab    Staff Present Lanny Hurst, RN, ADN;Roger Shel[place], RN, BSN, CCRP   Rory Percy, MS   Virtual Visit No    Medication changes reported     No    Fall or balance concerns reported    No    Warm-up and Cool-down Performed on first and last piece of equipment    Resistance Training Performed Yes    VAD Patient? No    PAD/SET Patient? No      Pain Assessment   Currently in Pain? No/denies                Social History   Tobacco Use  Smoking Status Never  Smokeless Tobacco Former   Quit date: 08/26/2011    Goals Met:  Independence with exercise equipment Exercise tolerated well No report of concerns or symptoms today Strength training completed today  Goals Unmet:  Not Applicable  Comments: Pt able to follow exercise prescription today without complaint.  Will continue to monitor for progression.    Dr. Bethann Punches is Medical Director for [facility] Cardiac Rehabilitation.  Dr. Vida Rigger is Medical Director for [facility] Pulmonary Rehabilitation.

## 2023-05-06 ENCOUNTER — Encounter: Payer: 59 | Admitting: *Deleted

## 2023-05-06 DIAGNOSIS — Z952 Presence of prosthetic heart valve: Secondary | ICD-10-CM

## 2023-05-06 DIAGNOSIS — Z48812 Encounter for surgical aftercare following surgery on the circulatory system: Secondary | ICD-10-CM | POA: Diagnosis not present

## 2023-05-06 NOTE — Progress Notes (Deleted)
Daily Session Note  Patient Details  Name: Michael Mckenzie MRN: [id number] Date of Birth: 1961-04-26 Referring Provider:   Flowsheet Row Cardiac Rehab from 04/18/2023 in [facility] Cardiac and Pulmonary Rehab  Referring Provider Dr. Sampson Goon       Encounter Date: 05/06/2023  Check In:  Session Check In - 05/06/23 0741       Check-In   Supervising physician immediately available to respond to emergencies See telemetry face sheet for immediately available ER MD    Location [facility]-Cardiac & Pulmonary Rehab    Staff Present Cyndia Diver, RN, BSN, Reynaldo Minium, RN, BSN, CCRP;Joseph [place], RCP,RRT,BSRT    Virtual Visit No    Medication changes reported     Yes    Fall or balance concerns reported    No    Tobacco Cessation No Change    Warm-up and Cool-down Performed on first and last piece of equipment    Resistance Training Performed Yes    VAD Patient? No    PAD/SET Patient? No      Pain Assessment   Currently in Pain? No/denies                Social History   Tobacco Use  Smoking Status Never  Smokeless Tobacco Former   Quit date: 08/26/2011    Goals Met:  Independence with exercise equipment Exercise tolerated well No report of concerns or symptoms today  Goals Unmet:  Not Applicable  Comments: Pt able to follow exercise prescription today without complaint.  Will continue to monitor for progression.    Dr. Bethann Punches is Medical Director for [facility] Cardiac Rehabilitation.  Dr. Vida Rigger is Medical Director for [facility] Pulmonary Rehabilitation.

## 2023-05-06 NOTE — Progress Notes (Signed)
Daily Session Note  Patient Details  Name: Michael Mckenzie MRN: [id number] Date of Birth: 10-25-1960 Referring Provider:   Flowsheet Row Cardiac Rehab from 04/18/2023 in [facility] Cardiac and Pulmonary Rehab  Referring Provider Dr. Sampson Goon       Encounter Date: 05/06/2023  Check In:  Session Check In - 05/06/23 0741       Check-In   Supervising physician immediately available to respond to emergencies See telemetry face sheet for immediately available ER MD    Location [facility]-Cardiac & Pulmonary Rehab    Staff Present Cyndia Diver, RN, BSN, MA;Susanne Bice, RN, BSN, CCRP;Joseph [place], RCP,RRT,BSRT    Virtual Visit No    Medication changes reported     No    Fall or balance concerns reported    No    Tobacco Cessation No Change    Warm-up and Cool-down Performed on first and last piece of equipment    Resistance Training Performed Yes    VAD Patient? No    PAD/SET Patient? No      Pain Assessment   Currently in Pain? No/denies                Social History   Tobacco Use  Smoking Status Never  Smokeless Tobacco Former   Quit date: 08/26/2011    Goals Met:  Independence with exercise equipment Exercise tolerated well No report of concerns or symptoms today  Goals Unmet:  Not Applicable  Comments: Pt able to follow exercise prescription today without complaint.  Will continue to monitor for progression.    Dr. Bethann Punches is Medical Director for [facility] Cardiac Rehabilitation.  Dr. Vida Rigger is Medical Director for [facility] Pulmonary Rehabilitation.

## 2023-05-09 ENCOUNTER — Encounter: Payer: 59 | Admitting: *Deleted

## 2023-05-09 DIAGNOSIS — Z48812 Encounter for surgical aftercare following surgery on the circulatory system: Secondary | ICD-10-CM | POA: Diagnosis not present

## 2023-05-09 DIAGNOSIS — Z952 Presence of prosthetic heart valve: Secondary | ICD-10-CM

## 2023-05-09 NOTE — Progress Notes (Signed)
Daily Session Note  Patient Details  Name: Michael Mckenzie MRN: [id number] Date of Birth: Dec 05, 1960 Referring Provider:   Flowsheet Row Cardiac Rehab from 04/18/2023 in [facility] Cardiac and Pulmonary Rehab  Referring Provider Dr. Sampson Goon       Encounter Date: 05/09/2023  Check In:  Session Check In - 05/09/23 0802       Check-In   Supervising physician immediately available to respond to emergencies See telemetry face sheet for immediately available ER MD    Location [facility]-Cardiac & Pulmonary Rehab    Staff Present Lanny Hurst, RN, ADN;Joseph Reino Kent, RCP,RRT,BSRT;Kelly Madilyn Fireman, BS, ACSM CEP, Exercise Physiologist    Virtual Visit No    Medication changes reported     No    Fall or balance concerns reported    No    Warm-up and Cool-down Performed on first and last piece of equipment    Resistance Training Performed Yes    VAD Patient? No    PAD/SET Patient? No      Pain Assessment   Currently in Pain? No/denies                Social History   Tobacco Use  Smoking Status Never  Smokeless Tobacco Former   Quit date: 08/26/2011    Goals Met:  Independence with exercise equipment Exercise tolerated well No report of concerns or symptoms today Strength training completed today  Goals Unmet:  Not Applicable  Comments: Pt able to follow exercise prescription today without complaint.  Will continue to monitor for progression.    Dr. Bethann Punches is Medical Director for [facility] Cardiac Rehabilitation.  Dr. Vida Rigger is Medical Director for [facility] Pulmonary Rehabilitation.

## 2023-05-11 ENCOUNTER — Encounter: Payer: 59 | Admitting: *Deleted

## 2023-05-11 DIAGNOSIS — Z48812 Encounter for surgical aftercare following surgery on the circulatory system: Secondary | ICD-10-CM | POA: Diagnosis not present

## 2023-05-11 DIAGNOSIS — Z952 Presence of prosthetic heart valve: Secondary | ICD-10-CM

## 2023-05-11 NOTE — Progress Notes (Signed)
Daily Session Note  Patient Details  Name: Michael Mckenzie MRN: [id number] Date of Birth: 07/08/1961 Referring Provider:   Flowsheet Row Cardiac Rehab from 04/18/2023 in [facility] Cardiac and Pulmonary Rehab  Referring Provider Dr. Sampson Goon       Encounter Date: 05/11/2023  Check In:  Session Check In - 05/11/23 0835       Check-In   Supervising physician immediately available to respond to emergencies See telemetry face sheet for immediately available ER MD    Location [facility]-Cardiac & Pulmonary Rehab    Staff Present Lanny Hurst, RN, ADN;Joseph [place], RCP,RRT,BSRT;Other   Girtha Rm, MS   Virtual Visit No    Medication changes reported     No    Fall or balance concerns reported    No    Warm-up and Cool-down Performed on first and last piece of equipment    Resistance Training Performed Yes    VAD Patient? No    PAD/SET Patient? No      Pain Assessment   Currently in Pain? No/denies                Social History   Tobacco Use  Smoking Status Never  Smokeless Tobacco Former   Quit date: 08/26/2011    Goals Met:  Independence with exercise equipment Exercise tolerated well No report of concerns or symptoms today Strength training completed today  Goals Unmet:  Not Applicable  Comments: Pt able to follow exercise prescription today without complaint.  Will continue to monitor for progression.    Dr. Bethann Punches is Medical Director for [facility] Cardiac Rehabilitation.  Dr. Vida Rigger is Medical Director for [facility] Pulmonary Rehabilitation.

## 2023-05-13 ENCOUNTER — Encounter: Payer: 59 | Attending: Cardiovascular Disease | Admitting: *Deleted

## 2023-05-13 DIAGNOSIS — Z952 Presence of prosthetic heart valve: Secondary | ICD-10-CM | POA: Insufficient documentation

## 2023-05-13 DIAGNOSIS — Z48812 Encounter for surgical aftercare following surgery on the circulatory system: Secondary | ICD-10-CM | POA: Insufficient documentation

## 2023-05-13 NOTE — Progress Notes (Signed)
Daily Session Note  Patient Details  Name: Michael Mckenzie MRN: [id number] Date of Birth: August 28, 1961 Referring Provider:   Flowsheet Row Cardiac Rehab from 04/18/2023 in [facility] Cardiac and Pulmonary Rehab  Referring Provider Dr. Sampson Goon       Encounter Date: 05/13/2023  Check In:  Session Check In - 05/13/23 0757       Check-In   Staff Present Cora Collum, RN, BSN, CCRP;Noah Tickle, BS, Exercise Physiologist    Virtual Visit No    Medication changes reported     No    Fall or balance concerns reported    No    Warm-up and Cool-down Performed on first and last piece of equipment    Resistance Training Performed Yes    VAD Patient? No    PAD/SET Patient? No      Pain Assessment   Currently in Pain? No/denies                Social History   Tobacco Use  Smoking Status Never  Smokeless Tobacco Former   Quit date: 08/26/2011    Goals Met:  Independence with exercise equipment Exercise tolerated well Personal goals reviewed No report of concerns or symptoms today  Goals Unmet:  Not Applicable  Comments: Pt able to follow exercise prescription today without complaint.  Will continue to monitor for progression.    Dr. Bethann Punches is Medical Director for [facility] Cardiac Rehabilitation.  Dr. Vida Rigger is Medical Director for [facility] Pulmonary Rehabilitation.

## 2023-05-16 ENCOUNTER — Encounter: Payer: 59 | Admitting: *Deleted

## 2023-05-16 DIAGNOSIS — Z48812 Encounter for surgical aftercare following surgery on the circulatory system: Secondary | ICD-10-CM | POA: Diagnosis not present

## 2023-05-16 DIAGNOSIS — Z952 Presence of prosthetic heart valve: Secondary | ICD-10-CM

## 2023-05-16 NOTE — Progress Notes (Signed)
Daily Session Note  Patient Details  Name: Michael Mckenzie MRN: [id number] Date of Birth: Mar 02, 1961 Referring Provider:   Flowsheet Row Cardiac Rehab from 04/18/2023 in [facility] Cardiac and Pulmonary Rehab  Referring Provider Dr. Sampson Goon       Encounter Date: 05/16/2023  Check In:  Session Check In - 05/16/23 0842       Check-In   Supervising physician immediately available to respond to emergencies See telemetry face sheet for immediately available ER MD    Location [facility]-Cardiac & Pulmonary Rehab    Staff Present Lanny Hurst, RN, ADN;Joseph Reino Kent, RCP,RRT,BSRT;Kelly Madilyn Fireman, BS, ACSM CEP, Exercise Physiologist    Virtual Visit No    Medication changes reported     No    Fall or balance concerns reported    No    Warm-up and Cool-down Performed on first and last piece of equipment    Resistance Training Performed Yes    VAD Patient? No    PAD/SET Patient? No      Pain Assessment   Currently in Pain? No/denies                Social History   Tobacco Use  Smoking Status Never  Smokeless Tobacco Former   Quit date: 08/26/2011    Goals Met:  Independence with exercise equipment Exercise tolerated well No report of concerns or symptoms today Strength training completed today  Goals Unmet:  Not Applicable  Comments: Pt able to follow exercise prescription today without complaint.  Will continue to monitor for progression.    Dr. Bethann Punches is Medical Director for [facility] Cardiac Rehabilitation.  Dr. Vida Rigger is Medical Director for [facility] Pulmonary Rehabilitation.

## 2023-05-18 ENCOUNTER — Encounter: Payer: 59 | Admitting: *Deleted

## 2023-05-18 ENCOUNTER — Encounter: Payer: Self-pay | Admitting: *Deleted

## 2023-05-18 DIAGNOSIS — Z952 Presence of prosthetic heart valve: Secondary | ICD-10-CM

## 2023-05-18 DIAGNOSIS — Z48812 Encounter for surgical aftercare following surgery on the circulatory system: Secondary | ICD-10-CM | POA: Diagnosis not present

## 2023-05-18 NOTE — Progress Notes (Signed)
Cardiac Individual Treatment Plan  Patient Details  Name: Michael Mckenzie MRN: [id number] Date of Birth: [birth year] Referring Provider:   Flowsheet Row Cardiac Rehab from 04/18/2023 in [facility] Cardiac and Pulmonary Rehab  Referring Provider Dr. Sampson Goon       Initial Encounter Date:  Flowsheet Row Cardiac Rehab from 04/18/2023 in [facility] Cardiac and Pulmonary Rehab  Date 04/18/23       Visit Diagnosis: S/P mitral valve replacement  Patient's Home Medications on Admission:  Current Outpatient Medications:    amiodarone (PACERONE) 200 MG tablet, Take 200 mg by mouth daily., Disp: , Rfl:    aspirin 81 MG chewable tablet, Chew 81 mg by mouth daily., Disp: , Rfl:    atorvastatin (LIPITOR) 40 MG tablet, Take 40 mg by mouth daily., Disp: , Rfl:    Calcium Citrate-Vitamin D (CALCIUM CITRATE + D PO), Take by mouth daily., Disp: , Rfl:    cyclobenzaprine (FLEXERIL) 5 MG tablet, Take 1-2 tablets 3 times daily as needed, Disp: 20 tablet, Rfl: 0   furosemide (LASIX) 20 MG tablet, Take 20 mg by mouth daily., Disp: , Rfl:    GLUCOSAMINE-CHONDROITIN PO, Take by mouth daily., Disp: , Rfl:    Multiple Vitamin (MULTIVITAMIN ADULT PO), Take 1 tablet by mouth daily., Disp: , Rfl:    Omega-3 Fatty Acids (FISH OIL PO), Take by mouth., Disp: , Rfl:    Potassium 99 MG TABS, Take 1 tablet by mouth daily., Disp: , Rfl:   Past Medical History: Past Medical History:  Diagnosis Date   Sleep apnea     Tobacco Use: Social History   Tobacco Use  Smoking Status Never  Smokeless Tobacco Former   Quit date: 08/26/2011    Labs: Review Flowsheet        No data to display           Exercise Target Goals: Exercise Program Goal: Individual exercise prescription set using results from initial 6 min walk test and THRR while considering  patient's activity barriers and safety.   Exercise Prescription Goal: Initial exercise prescription builds to 30-45 minutes a day of aerobic activity, 2-3 days per week.   Home exercise guidelines will be given to patient during program as part of exercise prescription that the participant will acknowledge.   Education: Aerobic Exercise: - Group verbal and visual presentation on the components of exercise prescription. Introduces F.I.T.T principle from ACSM for exercise prescriptions.  Reviews F.I.T.T. principles of aerobic exercise including progression. Written material given at graduation.   Education: Resistance Exercise: - Group verbal and visual presentation on the components of exercise prescription. Introduces F.I.T.T principle from ACSM for exercise prescriptions  Reviews F.I.T.T. principles of resistance exercise including progression. Written material given at graduation.    Education: Exercise & Equipment Safety: - Individual verbal instruction and demonstration of equipment use and safety with use of the equipment. Flowsheet Row Cardiac Rehab from 05/18/2023 in [facility] Cardiac and Pulmonary Rehab  Date 04/18/23  Educator [facility]  Instruction Review Code 1- Verbalizes Understanding       Education: Exercise Physiology & General Exercise Guidelines: - Group verbal and written instruction with models to review the exercise physiology of the cardiovascular system and associated critical values. Provides general exercise guidelines with specific guidelines to those with heart or lung disease.  Flowsheet Row Cardiac Rehab from 05/18/2023 in [facility] Cardiac and Pulmonary Rehab  Date 05/18/23  Educator [facility]  Instruction Review Code 1- [employer] Understanding       Education: Flexibility, Balance,  Mind/Body Relaxation: - Group verbal and visual presentation with interactive activity on the components of exercise prescription. Introduces F.I.T.T principle from ACSM for exercise prescriptions. Reviews F.I.T.T. principles of flexibility and balance exercise training including progression. Also discusses the mind body connection.  Reviews various relaxation techniques  to help reduce and manage stress (i.e. Deep breathing, progressive muscle relaxation, and visualization). Balance handout provided to take home. Written material given at graduation.   Activity Barriers & Risk Stratification:  Activity Barriers & Cardiac Risk Stratification - 04/18/23 1718       Activity Barriers & Cardiac Risk Stratification   Activity Barriers Joint Problems             6 Minute Walk:  6 Minute Walk     Row Name 04/18/23 1717         6 Minute Walk   Phase Initial     Distance 1540 feet     Walk Time 6 minutes     # of Rest Breaks 0     MPH 2.92     METS 4.23     RPE 10     Perceived Dyspnea  0     VO2 Peak 14.8     Symptoms No     Resting HR 85 bpm     Resting BP 132/80     Resting Oxygen Saturation  97 %     Exercise Oxygen Saturation  during 6 min walk 99 %     Max Ex. HR 119 bpm     Max Ex. BP 162/82     2 Minute Post BP 132/80              Oxygen Initial Assessment:   Oxygen Re-Evaluation:   Oxygen Discharge (Final Oxygen Re-Evaluation):   Initial Exercise Prescription:  Initial Exercise Prescription - 04/18/23 1700       Date of Initial Exercise RX and Referring Provider   Date 04/18/23    Referring Provider Dr. Sampson Goon      Oxygen   Maintain Oxygen Saturation 88% or higher      Treadmill   MPH 3    Grade 1    Minutes 15    METs 3.71      Recumbant Elliptical   Level 2    RPM 50    Minutes 15    METs 4.23      REL-XR   Level 2    Speed 50    Minutes 15    METs 4.23      Prescription Details   Frequency (times per week) 3    Duration Progress to 30 minutes of continuous aerobic without signs/symptoms of physical distress      Intensity   THRR 40-80% of Max Heartrate 113-142    Ratings of Perceived Exertion 11-13    Perceived Dyspnea 0-4      Progression   Progression Continue to progress workloads to maintain intensity without signs/symptoms of physical distress.      Resistance Training    Training Prescription Yes    Weight 3    Reps 10-15             Perform Capillary Blood Glucose checks as needed.  Exercise Prescription Changes:   Exercise Prescription Changes     Row Name 04/18/23 1700 05/04/23 1100 05/13/23 0800 05/17/23 1500       Response to Exercise   Blood Pressure (Admit) 132/80 96/58 -- 112/70    Blood Pressure (Exercise)  162/82 156/82 -- 132/58    Blood Pressure (Exit) 132/82 102/60 -- 130/70    Heart Rate (Admit) 85 bpm 101 bpm -- 101 bpm    Heart Rate (Exercise) 119 bpm 136 bpm -- 139 bpm    Heart Rate (Exit) 99 bpm 111 bpm -- 102 bpm    Oxygen Saturation (Admit) 97 % -- -- --    Oxygen Saturation (Exercise) 99 % -- -- --    Oxygen Saturation (Exit) 99 % -- -- --    Rating of Perceived Exertion (Exercise) 10 -- -- 13    Perceived Dyspnea (Exercise) 0 -- -- --    Symptoms none -- -- --    Comments 6 MWT results -- -- --    Duration -- Progress to 30 minutes of  aerobic without signs/symptoms of physical distress -- Progress to 30 minutes of  aerobic without signs/symptoms of physical distress    Intensity -- THRR unchanged -- THRR unchanged      Progression   Progression -- Continue to progress workloads to maintain intensity without signs/symptoms of physical distress. -- Continue to progress workloads to maintain intensity without signs/symptoms of physical distress.    Average METs -- -- -- 3.6      Resistance Training   Training Prescription -- Yes -- Yes    Weight -- 3 -- 3    Reps -- 10-15 -- 10-15      Treadmill   MPH -- 2.4 -- 3    Grade -- 3 -- 2    Minutes -- 15 -- 15    METs -- 3.83 -- 4.12      REL-XR   Level -- 4 -- 7    Minutes -- 15 -- 15    METs -- -- -- 3.7      T5 Nustep   Level -- 5 -- 2    Minutes -- 15 -- 15    METs -- 3.2 -- 3      Home Exercise Plan   Plans to continue exercise at -- -- Home (comment)  Walking at home and park, resistance bands Home (comment)  Walking at home and park, resistance bands     Frequency -- -- Add 2 additional days to program exercise sessions. Add 2 additional days to program exercise sessions.    Initial Home Exercises Provided -- -- 05/13/23 05/13/23      Oxygen   Maintain Oxygen Saturation -- 88% or higher 88% or higher 88% or higher             Exercise Comments:   Exercise Comments     Row Name 04/22/23 1610           Exercise Comments First full day of exercise!  Patient was oriented to gym and equipment including functions, settings, policies, and procedures.  Patient's individual exercise prescription and treatment plan were reviewed.  All starting workloads were established based on the results of the 6 minute walk test done at initial orientation visit.  The plan for exercise progression was also introduced and progression will be customized based on patient's performance and goals.                Exercise Goals and Review:   Exercise Goals     Row Name 04/18/23 1748             Exercise Goals   Increase Physical Activity Yes       Intervention Provide advice, education, support and  counseling about physical activity/exercise needs.;Develop an individualized exercise prescription for aerobic and resistive training based on initial evaluation findings, risk stratification, comorbidities and participant's personal goals.       Expected Outcomes Short Term: Attend rehab on a regular basis to increase amount of physical activity.;Long Term: Add in home exercise to make exercise part of routine and to increase amount of physical activity.;Long Term: Exercising regularly at least 3-5 days a week.       Increase Strength and Stamina Yes       Intervention Provide advice, education, support and counseling about physical activity/exercise needs.;Develop an individualized exercise prescription for aerobic and resistive training based on initial evaluation findings, risk stratification, comorbidities and participant's personal goals.        Expected Outcomes Short Term: Increase workloads from initial exercise prescription for resistance, speed, and METs.;Short Term: Perform resistance training exercises routinely during rehab and add in resistance training at home;Long Term: Improve cardiorespiratory fitness, muscular endurance and strength as measured by increased METs and functional capacity ( )       Able to understand and use rate of perceived exertion (RPE) scale Yes       Intervention Provide education and explanation on how to use RPE scale       Expected Outcomes Short Term: Able to use RPE daily in rehab to express subjective intensity level;Long Term:  Able to use RPE to guide intensity level when exercising independently       Able to understand and use Dyspnea scale Yes       Intervention Provide education and explanation on how to use Dyspnea scale       Expected Outcomes Short Term: Able to use Dyspnea scale daily in rehab to express subjective sense of shortness of breath during exertion;Long Term: Able to use Dyspnea scale to guide intensity level when exercising independently       Knowledge and understanding of Target Heart Rate Range (THRR) Yes       Intervention Provide education and explanation of THRR including how the numbers were predicted and where they are located for reference       Expected Outcomes Short Term: Able to state/look up THRR;Long Term: Able to use THRR to govern intensity when exercising independently;Short Term: Able to use daily as guideline for intensity in rehab       Able to check pulse independently Yes       Intervention Provide education and demonstration on how to check pulse in carotid and radial arteries.;Review the importance of being able to check your own pulse for safety during independent exercise       Expected Outcomes Short Term: Able to explain why pulse checking is important during independent exercise;Long Term: Able to check pulse independently and accurately        Understanding of Exercise Prescription Yes       Intervention Provide education, explanation, and written materials on patient's individual exercise prescription       Expected Outcomes Short Term: Able to explain program exercise prescription;Long Term: Able to explain home exercise prescription to exercise independently                Exercise Goals Re-Evaluation :  Exercise Goals Re-Evaluation     Row Name 04/22/23 1308 05/04/23 1154 05/13/23 0820 05/17/23 1530       Exercise Goal Re-Evaluation   Exercise Goals Review Understanding of Exercise Prescription;Able to understand and use rate of perceived exertion (RPE) scale;Able to understand and  use Dyspnea scale;Knowledge and understanding of Target Heart Rate Range (THRR) Increase Physical Activity;Increase Strength and Stamina;Understanding of Exercise Prescription Increase Physical Activity;Increase Strength and Stamina;Understanding of Exercise Prescription Increase Physical Activity;Increase Strength and Stamina;Understanding of Exercise Prescription    Comments Reviewed RPE  and dyspnea scale, THR and program prescription with pt today.  Pt voiced understanding and was given a copy of goals to take home. Monserrate has started the program on 7/12. He is pprogressing well. Increasing workloads . He is at level 4 on the REXR, 2.4/3 on the TM and up to level 5 on the T4 Nustep. His RPE are at 13 and he is using 3 lb handweights. Will continue to monitor his exercise progression. Reviewed home exercise with pt today.  Pt plans to walk at home and the park as well as use resistance bands for exercise.  Reviewed THR, pulse, RPE, sign and symptoms, pulse oximetery and when to call 911 or MD.  Also discussed weather considerations and indoor options.  Pt voiced understanding. Kort continues to do well in rehab. He has increased his treadmill workload and XR level to 7. He has stayed consistent with 3lbs for resistance training. We will continue to  monitor his progress in the program.    Expected Outcomes Short: Use RPE daily to regulate intensity. Long: Follow program prescription in THR. [id number] workloads as tolerated with goal of increased stamina and strength.   LTG: Continued exercise progression as tolerated during program and after discharge. Short: Begin walking at home on days away from rehab. Long: Continue exercise to improve strength and stamina. Short: Continue to progressively increase treadmill and XR workload. Try increasing hand weights to 4lbs for resistance training. Long: Continue exercise to improve strength and stamina.             Discharge Exercise Prescription (Final Exercise Prescription Changes):  Exercise Prescription Changes - 05/17/23 1500       Response to Exercise   Blood Pressure (Admit) 112/70    Blood Pressure (Exercise) 132/58    Blood Pressure (Exit) 130/70    Heart Rate (Admit) 101 bpm    Heart Rate (Exercise) 139 bpm    Heart Rate (Exit) 102 bpm    Rating of Perceived Exertion (Exercise) 13    Duration Progress to 30 minutes of  aerobic without signs/symptoms of physical distress    Intensity THRR unchanged      Progression   Progression Continue to progress workloads to maintain intensity without signs/symptoms of physical distress.    Average METs 3.6      Resistance Training   Training Prescription Yes    Weight 3    Reps 10-15      Treadmill   MPH 3    Grade 2    Minutes 15    METs 4.12      REL-XR   Level 7    Minutes 15    METs 3.7      T5 Nustep   Level 2    Minutes 15    METs 3      Home Exercise Plan   Plans to continue exercise at Home (comment)   Walking at home and park, resistance bands   Frequency Add 2 additional days to program exercise sessions.    Initial Home Exercises Provided 05/13/23      Oxygen   Maintain Oxygen Saturation 88% or higher             Nutrition:  Target Goals:  Understanding of nutrition guidelines, daily intake of  sodium 1500mg , cholesterol 200mg , calories 30% from fat and 7% or less from saturated fats, daily to have 5 or more servings of fruits and vegetables.  Education: All About Nutrition: -Group instruction provided by verbal, written material, interactive activities, discussions, models, and posters to present general guidelines for heart healthy nutrition including fat, fiber, MyPlate, the role of sodium in heart healthy nutrition, utilization of the nutrition label, and utilization of this knowledge for meal planning. Follow up email sent as well. Written material given at graduation. Flowsheet Row Cardiac Rehab from 05/18/2023 in [facility] Cardiac and Pulmonary Rehab  Education need identified 04/18/23       Biometrics:  Pre Biometrics - 04/18/23 1748       Pre Biometrics   Height 5\' 9"  (1.753 m)    Weight 181 lb 11.2 oz (82.4 kg)    Waist Circumference 38 inches    Hip Circumference 42 inches    Waist to Hip Ratio 0.9 %    BMI (Calculated) 26.82    Single Leg Stand 21.22 seconds              Nutrition Therapy Plan and Nutrition Goals:  Nutrition Therapy & Goals - 04/25/23 1041       Nutrition Therapy   Diet Cardiac, low Na    Protein (specify units) 90    Fiber 30 grams    Whole Grain Foods 3 servings    Saturated Fats 1.5 max. grams    Fruits and Vegetables 5 servings/day    Sodium 2 grams      Personal Nutrition Goals   Nutrition Goal Drink 2 bottles of water per day (bring water on commute)    Personal Goal #2 Pair a protein and carb at meals    Personal Goal #3 Be mindful of routine and consistency    Comments Patient has good family support, his wife is also following nutrition guidance for cancer recovery. Neither eat large portions and both like fruits and veggies. They have been staying away from carb heavy foods and watching their sugary foods. All good choices, encouraged them to continue to be mindful of food choices and allow for moderation of comfort foods from  time to time. Educated on [employer] handout, types of fats, sources, and how to read food labels. Reviewed sodium content of some of his favorite foods, frozen meals, and snacks. He is drinking 1 bottle of water per day. Talked about ways to increase, such as bringing a bottle with him on his commutes to work. He reports snacking during his drive home after long shifts. His routine is inconsistent with busy work and life schedules. 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 Set follow up appointment in ~64month to work together on goals after going back to work and switching to day shift.      Intervention Plan   Intervention Prescribe, educate and counsel regarding individualized specific dietary modifications aiming towards targeted core components such as weight, hypertension, lipid management, diabetes, heart failure and other comorbidities.;Nutrition handout(s) given to patient.    Expected Outcomes Short Term Goal: Understand basic principles of dietary content, such as calories, fat, sodium, cholesterol and nutrients.;Short Term Goal: A plan has been developed with personal nutrition goals set during dietitian appointment.;Long Term Goal: Adherence to prescribed nutrition plan.  Nutrition Assessments:  MEDIFICTS Score Key: ?70 Need to make dietary changes  40-70 Heart Healthy Diet ? 40 Therapeutic Level Cholesterol Diet  Flowsheet Row Cardiac Rehab from 04/18/2023 in [facility] Cardiac and Pulmonary Rehab  Picture Your Plate Total Score on Admission 58      Picture Your Plate Scores: <16 Unhealthy dietary pattern with much room for improvement. 41-50 Dietary pattern unlikely to meet recommendations for good health and room for improvement. 51-60 More healthful dietary pattern, with some room for improvement.  >60 Healthy dietary pattern, although there may  be some specific behaviors that could be improved.    Nutrition Goals Re-Evaluation:  Nutrition Goals Re-Evaluation     Row Name 05/13/23 0803             Goals   Comment Verl states that he is doing well with the dietary guidelines given to him by the RD. He states that he has been making adjustments such as drinking more water, eating more vegetables, and paying attention to his sodium intake. He has also been trying to improve more protein in his diet as recommended by the RD.       Expected Outcome Short: Continue to monitor sodium intake. Long: Continue heart-healthy eating patterns.                Nutrition Goals Discharge (Final Nutrition Goals Re-Evaluation):  Nutrition Goals Re-Evaluation - 05/13/23 0803       Goals   Comment Harden states that he is doing well with the dietary guidelines given to him by the RD. He states that he has been making adjustments such as drinking more water, eating more vegetables, and paying attention to his sodium intake. He has also been trying to improve more protein in his diet as recommended by the RD.    Expected Outcome Short: Continue to monitor sodium intake. Long: Continue heart-healthy eating patterns.             Psychosocial: Target Goals: Acknowledge presence or absence of significant depression and/or stress, maximize coping skills, provide positive support system. Participant is able to verbalize types and ability to use techniques and skills needed for reducing stress and depression.   Education: Stress, Anxiety, and Depression - Group verbal and visual presentation to define topics covered.  Reviews how body is impacted by stress, anxiety, and depression.  Also discusses healthy ways to reduce stress and to treat/manage anxiety and depression.  Written material given at graduation. Flowsheet Row Cardiac Rehab from 05/18/2023 in [facility] Cardiac and Pulmonary Rehab  Date 05/11/23  Educator SB  Instruction Review Code 1-  [employer] Understanding       Education: Sleep Hygiene -Provides group verbal and written instruction about how sleep can affect your health.  Define sleep hygiene, discuss sleep cycles and impact of sleep habits. Review good sleep hygiene tips.    Initial Review & Psychosocial Screening:  Initial Psych Review & Screening - 04/12/23 1318       Initial Review   Current issues with Current Stress Concerns    Comments healing process, off routine      Family Dynamics   Good Support System? Yes   wife, daughter     Barriers   Psychosocial barriers to participate in program There are no identifiable barriers or psychosocial needs.;The patient should benefit from training in stress management and relaxation.      Screening Interventions   Interventions Encouraged to exercise;Provide feedback about the scores to participant;To provide support and  resources with identified psychosocial needs    Expected Outcomes Short Term goal: Utilizing psychosocial counselor, staff and physician to assist with identification of specific Stressors or current issues interfering with healing process. Setting desired goal for each stressor or current issue identified.;Long Term Goal: Stressors or current issues are controlled or eliminated.;Short Term goal: Identification and review with participant of any Quality of Life or Depression concerns found by scoring the questionnaire.;Long Term goal: The participant improves quality of Life and PHQ9 Scores as seen by post scores and/or verbalization of changes             Quality of Life Scores:   Quality of Life - 04/18/23 1749       Quality of Life   Select Quality of Life      Quality of Life Scores   Health/Function Pre 23.18 %    Socioeconomic Pre 24.57 %    Psych/Spiritual Pre 22 %    Family Pre 18.13 %    GLOBAL Pre 22.59 %            Scores of 19 and below usually indicate a poorer quality of life in these areas.  A difference of  2-3  points is a clinically meaningful difference.  A difference of 2-3 points in the total score of the Quality of Life Index has been associated with significant improvement in overall quality of life, self-image, physical symptoms, and general health in studies assessing change in quality of life.  PHQ-9: Review Flowsheet       04/18/2023  Depression screen PHQ 2/9  Decreased Interest 1  Down, Depressed, Hopeless 0  PHQ - 2 Score 1  Altered sleeping 0  Tired, decreased energy 1  Change in appetite 0  Feeling bad or failure about yourself  0  Trouble concentrating 2  Moving slowly or fidgety/restless 0  Suicidal thoughts 0  PHQ-9 Score 4  Difficult doing work/chores Not difficult at all    Details           Interpretation of Total Score  Total Score Depression Severity:  1-4 = Minimal depression, 5-9 = Mild depression, 10-14 = Moderate depression, 15-19 = Moderately severe depression, 20-27 = Severe depression   Psychosocial Evaluation and Intervention:  Psychosocial Evaluation - 04/12/23 1326       Psychosocial Evaluation & Interventions   Interventions Encouraged to exercise with the program and follow exercise prescription    Comments Mitt is 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 He originally went to his doctor for a cough and they found the heart murmer, so needing heart surgery was a surprise. He is currently on medical leave from work with plans to return when his doctor clears him. His wife and daughter are his main support system. He mentions his wife does have other health concerns, but doing well currently. He feels like he is healing well from surgery. 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 He is hopeful with time the nerve issues recover and he is working with his dentist regarding the dental pain. He is looking forward  to coming to the program to work on his exercise routine and education    Expected Outcomes Short: attend cardiac rehab for education and exercise. Long: develop and maintain positive self care habits.    Continue Psychosocial Services  Follow up required  by staff             Psychosocial Re-Evaluation:  Psychosocial Re-Evaluation     Row Name 05/13/23 [phone]             Psychosocial Re-Evaluation   Current issues with Current Stress Concerns       Comments Tremain is dealing with some stress due to the unknowns around his work situation. He wants to return to work but does not know when he will be able to yet. He also reports havinmg a good support system at home made up by his wife and grandson. He also reports sleeping good at this time and is using his CPAP. He states to relieve stress he likes to work on wood projects in his shop and work in his yard.       Expected Outcomes Short: Continue to exercise for stress relief. Long: Maintain positive outlook.       Interventions Encouraged to attend Cardiac Rehabilitation for the exercise       Continue Psychosocial Services  Follow up required by staff                Psychosocial Discharge (Final Psychosocial Re-Evaluation):  Psychosocial Re-Evaluation - 05/13/23 0758       Psychosocial Re-Evaluation   Current issues with Current Stress Concerns    Comments Kavaughn is dealing with some stress due to the unknowns around his work situation. He wants to return to work but does not know when he will be able to yet. He also reports havinmg a good support system at home made up by his wife and grandson. He also reports sleeping good at this time and is using his CPAP. He states to relieve stress he likes to work on wood projects in his shop and work in his yard.    Expected Outcomes Short: Continue to exercise for stress relief. Long: Maintain positive outlook.    Interventions Encouraged to attend Cardiac Rehabilitation for the exercise     Continue Psychosocial Services  Follow up required by staff             Vocational Rehabilitation: Provide vocational rehab assistance to qualifying candidates.   Vocational Rehab Evaluation & Intervention:  Vocational Rehab - 04/12/23 1309       Initial Vocational Rehab Evaluation & Intervention   Assessment shows need for Vocational Rehabilitation No             Education: Education Goals: Education classes will be provided on a variety of topics geared toward better understanding of heart health and risk factor modification. Participant will state understanding/return demonstration of topics presented as noted by education test scores.  Learning Barriers/Preferences:  Learning Barriers/Preferences - 04/12/23 1308       Learning Barriers/Preferences   Learning Barriers None    Learning Preferences None             General Cardiac Education Topics:  AED/CPR: - Group verbal and written instruction with the use of models to demonstrate the basic use of the AED with the basic ABC's of resuscitation.   Anatomy and Cardiac Procedures: - Group verbal and visual presentation and models provide information about basic cardiac anatomy and function. Reviews the testing methods done to diagnose heart disease and the outcomes of the test results. Describes the treatment choices: Medical Management, Angioplasty, or Coronary Bypass Surgery for treating various heart conditions including Myocardial Infarction, Angina, Valve Disease, and Cardiac Arrhythmias.  Written material given at graduation. Flowsheet  Row Cardiac Rehab from 05/18/2023 in [facility] Cardiac and Pulmonary Rehab  Education need identified 04/18/23       Medication Safety: - Group verbal and visual instruction to review commonly prescribed medications for heart and lung disease. Reviews the medication, class of the drug, and side effects. Includes the steps to properly store meds and maintain the prescription  regimen.  Written material given at graduation.   Intimacy: - Group verbal instruction through game format to discuss how heart and lung disease can affect sexual intimacy. Written material given at graduation..   Know Your Numbers and Heart Failure: - Group verbal and visual instruction to discuss disease risk factors for cardiac and pulmonary disease and treatment options.  Reviews associated critical values for Overweight/Obesity, Hypertension, Cholesterol, and Diabetes.  Discusses basics of heart failure: signs/symptoms and treatments.  Introduces Heart Failure Zone chart for action plan for heart failure.  Written material given at graduation. Flowsheet Row Cardiac Rehab from 05/18/2023 in [facility] Cardiac and Pulmonary Rehab  Date 04/27/23  Educator SB  Instruction Review Code 1- Verbalizes Understanding       Infection Prevention: - Provides verbal and written material to individual with discussion of infection control including proper hand washing and proper equipment cleaning during exercise session. Flowsheet Row Cardiac Rehab from 05/18/2023 in [facility] Cardiac and Pulmonary Rehab  Date 04/18/23  Educator [facility]  Instruction Review Code 1- Verbalizes Understanding       Falls Prevention: - Provides verbal and written material to individual with discussion of falls prevention and safety. Flowsheet Row Cardiac Rehab from 05/18/2023 in [facility] Cardiac and Pulmonary Rehab  Date 04/18/23  Educator [facility]  Instruction Review Code 1- Verbalizes Understanding       Other: -Provides group and verbal instruction on various topics (see comments)   Knowledge Questionnaire Score:  Knowledge Questionnaire Score - 04/18/23 1749       Knowledge Questionnaire Score   Pre Score 21/26             Core Components/Risk Factors/Patient Goals at Admission:  Personal Goals and Risk Factors at Admission - 04/18/23 1751       Core Components/Risk Factors/Patient Goals on Admission    Weight  Management Yes    Intervention Weight Management: Develop a combined nutrition and exercise program designed to reach desired caloric intake, while maintaining appropriate intake of nutrient and fiber, sodium and fats, and appropriate energy expenditure required for the weight goal.;Weight Management: Provide education and appropriate resources to help participant work on and attain dietary goals.    Admit Weight 181 lb 11.2 oz (82.4 kg)    Goal Weight: Short Term 180 lb (81.6 kg)    Goal Weight: Long Term 180 lb (81.6 kg)    Expected Outcomes Short Term: Continue to assess and modify interventions until short term weight is achieved;Long Term: Adherence to nutrition and physical activity/exercise program aimed toward attainment of established weight goal;Weight Maintenance: Understanding of the daily nutrition guidelines, which includes 25-35% calories from fat, 7% or less cal from saturated fats, less than 200mg  cholesterol, less than 1.5gm of sodium, & 5 or more servings of fruits and vegetables daily;Understanding recommendations for meals to include 15-35% energy as protein, 25-35% energy from fat, 35-60% energy from carbohydrates, less than 200mg  of dietary cholesterol, 20-35 gm of total fiber daily;Understanding of distribution of calorie intake throughout the day with the consumption of 4-5 meals/snacks    Lipids Yes    Intervention Provide education and support for participant on  nutrition & aerobic/resistive exercise along with prescribed medications to achieve LDL 70mg , HDL >40mg .    Expected Outcomes Short Term: Participant states understanding of desired cholesterol values and is compliant with medications prescribed. Participant is following exercise prescription and nutrition guidelines.;Long Term: Cholesterol controlled with medications as prescribed, with individualized exercise RX and with personalized nutrition plan. Value goals: LDL < 70mg , HDL > 40 mg.              Education:Diabetes - Individual verbal and written instruction to review signs/symptoms of diabetes, desired ranges of glucose level fasting, after meals and with exercise. Acknowledge that pre and post exercise glucose checks will be done for 3 sessions at entry of program.   Core Components/Risk Factors/Patient Goals Review:   Goals and Risk Factor Review     Row Name 05/13/23 0806             Core Components/Risk Factors/Patient Goals Review   Personal Goals Review Weight Management/Obesity;Lipids       Review Tyle states that he is comfortable with where his weight is right now and would like to work on maintenance. He weighed in today at 180.7 lbs. He also reports that he has been taking all of his medications as prescribed and exercising regularly to control his lipids. We will continue to check in with Jonny Ruiz regarding his risk factors.       Expected Outcomes Short: Continue to take all medications as prescribed. Long: Continue to monitor lifestyle risk factors.                Core Components/Risk Factors/Patient Goals at Discharge (Final Review):   Goals and Risk Factor Review - 05/13/23 0806       Core Components/Risk Factors/Patient Goals Review   Personal Goals Review Weight Management/Obesity;Lipids    Review Vencil states that he is comfortable with where his weight is right now and would like to work on maintenance. He weighed in today at 180.7 lbs. He also reports that he has been taking all of his medications as prescribed and exercising regularly to control his lipids. We will continue to check in with Jonny Ruiz regarding his risk factors.    Expected Outcomes Short: Continue to take all medications as prescribed. Long: Continue to monitor lifestyle risk factors.             ITP Comments:  ITP Comments     Row Name 04/12/23 1315 04/18/23 1717 04/19/23 1353 04/22/23 0821 05/18/23 1113   ITP Comments Initial phone call completed. Diagnosis can be found in [facility]  6/4. EP Orientation scheduled for Monday 7/8 at 3pm. Completed and gym orientation. Initial ITP created and sent for review to Dr. Bethann Punches, Medical Director. 30 Day review completed. Medical Director ITP review done, changes made as directed, and signed approval by Medical Director.    new to program First full day of exercise!  Patient was oriented to gym and equipment including functions, settings, policies, and procedures.  Patient's individual exercise prescription and treatment plan were reviewed.  All starting workloads were established based on the results of the 6 minute walk test done at initial orientation visit.  The plan for exercise progression was also introduced and progression will be customized based on patient's performance and goals. 30 Day review completed. Medical Director ITP review done, changes made as directed, and signed approval by Medical Director.   new to program            Comments:

## 2023-05-18 NOTE — Progress Notes (Signed)
Daily Session Note  Patient Details  Name: Michael Mckenzie MRN: [id number] Date of Birth: 10-29-1960 Referring Provider:   Flowsheet Row Cardiac Rehab from 04/18/2023 in [facility] Cardiac and Pulmonary Rehab  Referring Provider Dr. Sampson Goon       Encounter Date: 05/18/2023  Check In:  Session Check In - 05/18/23 0757       Check-In   Supervising physician immediately available to respond to emergencies See telemetry face sheet for immediately available ER MD    Location [facility]-Cardiac & Pulmonary Rehab    Staff Present Lanny Hurst, RN, ADN;Joseph [place], RCP,RRT,BSRT;Other   Girtha Rm, MS   Virtual Visit No    Medication changes reported     No    Fall or balance concerns reported    No    Warm-up and Cool-down Performed on first and last piece of equipment    Resistance Training Performed Yes    VAD Patient? No    PAD/SET Patient? No      Pain Assessment   Currently in Pain? No/denies                Social History   Tobacco Use  Smoking Status Never  Smokeless Tobacco Former   Quit date: 08/26/2011    Goals Met:  Independence with exercise equipment Exercise tolerated well No report of concerns or symptoms today Strength training completed today  Goals Unmet:  Not Applicable  Comments: Pt able to follow exercise prescription today without complaint.  Will continue to monitor for progression.    Dr. Bethann Punches is Medical Director for [facility] Cardiac Rehabilitation.  Dr. Vida Rigger is Medical Director for [facility] Pulmonary Rehabilitation.

## 2023-05-20 ENCOUNTER — Encounter: Payer: 59 | Admitting: *Deleted

## 2023-05-20 DIAGNOSIS — Z952 Presence of prosthetic heart valve: Secondary | ICD-10-CM

## 2023-05-20 DIAGNOSIS — Z48812 Encounter for surgical aftercare following surgery on the circulatory system: Secondary | ICD-10-CM | POA: Diagnosis not present

## 2023-05-20 NOTE — Progress Notes (Signed)
Daily Session Note  Patient Details  Name: Michael Mckenzie MRN: [id number] Date of Birth: 1961-02-09 Referring Provider:   Flowsheet Row Cardiac Rehab from 04/18/2023 in [facility] Cardiac and Pulmonary Rehab  Referring Provider Dr. Sampson Goon       Encounter Date: 05/20/2023  Check In:  Session Check In - 05/20/23 0928       Check-In   Supervising physician immediately available to respond to emergencies See telemetry face sheet for immediately available ER MD    Location [facility]-Cardiac & Pulmonary Rehab    Staff Present Cora Collum, RN, BSN, CCRP;Joseph [place], RCP,RRT,BSRT;Noah [place], [place], Exercise Physiologist    Virtual Visit No    Medication changes reported     No    Fall or balance concerns reported    No    Warm-up and Cool-down Performed on first and last piece of equipment    Resistance Training Performed Yes    VAD Patient? No    PAD/SET Patient? No      Pain Assessment   Currently in Pain? No/denies                Social History   Tobacco Use  Smoking Status Never  Smokeless Tobacco Former   Quit date: 08/26/2011    Goals Met:  Independence with exercise equipment Exercise tolerated well No report of concerns or symptoms today  Goals Unmet:  Not Applicable  Comments: Pt able to follow exercise prescription today without complaint.  Will continue to monitor for progression.    Dr. Bethann Punches is Medical Director for [facility] Cardiac Rehabilitation.  Dr. Vida Rigger is Medical Director for [facility] Pulmonary Rehabilitation.

## 2023-05-23 ENCOUNTER — Encounter: Payer: 59 | Admitting: *Deleted

## 2023-05-23 DIAGNOSIS — Z952 Presence of prosthetic heart valve: Secondary | ICD-10-CM

## 2023-05-23 DIAGNOSIS — Z48812 Encounter for surgical aftercare following surgery on the circulatory system: Secondary | ICD-10-CM | POA: Diagnosis not present

## 2023-05-23 NOTE — Progress Notes (Signed)
Daily Session Note  Patient Details  Name: Michael Mckenzie MRN: [id number] Date of Birth: Aug 20, 1961 Referring Provider:   Flowsheet Row Cardiac Rehab from 04/18/2023 in [facility] Cardiac and Pulmonary Rehab  Referring Provider Dr. Sampson Goon       Encounter Date: 05/23/2023  Check In:  Session Check In - 05/23/23 0758       Check-In   Supervising physician immediately available to respond to emergencies See telemetry face sheet for immediately available ER MD    Location [facility]-Cardiac & Pulmonary Rehab    Staff Present Lanny Hurst, RN, ADN;Joseph Reino [place], RCP,RRT,BSRT;Kelly Madilyn Fireman, BS, ACSM CEP, Exercise Physiologist    Virtual Visit No    Medication changes reported     No    Fall or balance concerns reported    No    Warm-up and Cool-down Performed on first and last piece of equipment    Resistance Training Performed Yes    VAD Patient? No    PAD/SET Patient? No      Pain Assessment   Currently in Pain? No/denies                Social History   Tobacco Use  Smoking Status Never  Smokeless Tobacco Former   Quit date: 08/26/2011    Goals Met:  Independence with exercise equipment Exercise tolerated well No report of concerns or symptoms today Strength training completed today  Goals Unmet:  Not Applicable  Comments: Pt able to follow exercise prescription today without complaint.  Will continue to monitor for progression.   Dr. Bethann Punches is Medical Director for [facility] Cardiac Rehabilitation.  Dr. Vida Rigger is Medical Director for [facility] Pulmonary Rehabilitation.

## 2023-05-25 ENCOUNTER — Encounter: Payer: 59 | Admitting: *Deleted

## 2023-05-25 DIAGNOSIS — Z952 Presence of prosthetic heart valve: Secondary | ICD-10-CM

## 2023-05-25 DIAGNOSIS — Z48812 Encounter for surgical aftercare following surgery on the circulatory system: Secondary | ICD-10-CM | POA: Diagnosis not present

## 2023-05-25 NOTE — Progress Notes (Signed)
Daily Session Note  Patient Details  Name: Michael Mckenzie MRN: [id number] Date of Birth: 06/18/1961 Referring Provider:   Flowsheet Row Cardiac Rehab from 04/18/2023 in [facility] Cardiac and Pulmonary Rehab  Referring Provider Dr. Sampson Goon       Encounter Date: 05/25/2023  Check In:  Session Check In - 05/25/23 0753       Check-In   Supervising physician immediately available to respond to emergencies See telemetry face sheet for immediately available ER MD    Location [facility]-Cardiac & Pulmonary Rehab    Staff Present Ronette Deter, BS, Exercise Physiologist;Margaret Best, MS, Exercise Physiologist;Trew Sunde Katrinka Blazing, RN, ADN    Virtual Visit No    Medication changes reported     No    Fall or balance concerns reported    No    Warm-up and Cool-down Performed on first and last piece of equipment    Resistance Training Performed Yes    VAD Patient? No    PAD/SET Patient? No      Pain Assessment   Currently in Pain? No/denies                Social History   Tobacco Use  Smoking Status Never  Smokeless Tobacco Former   Quit date: 08/26/2011    Goals Met:  Independence with exercise equipment Exercise tolerated well No report of concerns or symptoms today Strength training completed today  Goals Unmet:  Not Applicable  Comments: Pt able to follow exercise prescription today without complaint.  Will continue to monitor for progression.   Dr. Bethann Punches is Medical Director for [facility] Cardiac Rehabilitation.  Dr. Vida Rigger is Medical Director for [facility] Pulmonary Rehabilitation.

## 2023-05-27 ENCOUNTER — Encounter: Payer: 59 | Admitting: *Deleted

## 2023-05-27 DIAGNOSIS — Z48812 Encounter for surgical aftercare following surgery on the circulatory system: Secondary | ICD-10-CM | POA: Diagnosis not present

## 2023-05-27 DIAGNOSIS — Z952 Presence of prosthetic heart valve: Secondary | ICD-10-CM

## 2023-05-27 NOTE — Progress Notes (Signed)
Daily Session Note  Patient Details  Name: Michael Mckenzie MRN: [id number] Date of Birth: 1961-06-04 Referring Provider:   Flowsheet Row Cardiac Rehab from 04/18/2023 in [facility] Cardiac and Pulmonary Rehab  Referring Provider Dr. Sampson Goon       Encounter Date: 05/27/2023  Check In:  Session Check In - 05/27/23 0747       Check-In   Supervising physician immediately available to respond to emergencies See telemetry face sheet for immediately available ER MD    Location [facility]-Cardiac & Pulmonary Rehab    Staff Present Cyndia Diver, RN, BSN, MA;Susanne Bice, RN, BSN, CCRP;Noah Tickle, BS, Exercise Physiologist    Virtual Visit No    Medication changes reported     No    Fall or balance concerns reported    No    Tobacco Cessation No Change    Warm-up and Cool-down Performed on first and last piece of equipment    Resistance Training Performed Yes    VAD Patient? No    PAD/SET Patient? No      Pain Assessment   Currently in Pain? No/denies                Social History   Tobacco Use  Smoking Status Never  Smokeless Tobacco Former   Quit date: 08/26/2011    Goals Met:  Independence with exercise equipment Exercise tolerated well No report of concerns or symptoms today  Goals Unmet:  Not Applicable  Comments: Pt able to follow exercise prescription today without complaint.  Will continue to monitor for progression.    Dr. Bethann Punches is Medical Director for [facility] Cardiac Rehabilitation.  Dr. Vida Rigger is Medical Director for [facility] Pulmonary Rehabilitation.

## 2023-06-15 ENCOUNTER — Encounter: Payer: 59 | Attending: Cardiovascular Disease | Admitting: *Deleted

## 2023-06-15 ENCOUNTER — Encounter: Payer: Self-pay | Admitting: *Deleted

## 2023-06-15 DIAGNOSIS — Z952 Presence of prosthetic heart valve: Secondary | ICD-10-CM | POA: Insufficient documentation

## 2023-06-15 NOTE — Progress Notes (Signed)
Daily Session Note  Patient Details  Name: Michael Mckenzie MRN: [id number] Date of Birth: Jun 20, 1961 Referring Provider:   Flowsheet Row Cardiac Rehab from 04/18/2023 in [facility] Cardiac and Pulmonary Rehab  Referring Provider Dr. Sampson Goon       Encounter Date: 06/15/2023  Check In:  Session Check In - 06/15/23 0815       Check-In   Supervising physician immediately available to respond to emergencies See telemetry face sheet for immediately available ER MD    Location [facility]-Cardiac & Pulmonary Rehab    Staff Present Ronette Deter, BS, Exercise Physiologist;Joseph Shelbie Proctor, RN, [place]    Virtual Visit No    Medication changes reported     No    Fall or balance concerns reported    No    Warm-up and Cool-down Performed on first and last piece of equipment    Resistance Training Performed Yes    VAD Patient? No    PAD/SET Patient? No      Pain Assessment   Currently in Pain? No/denies                Social History   Tobacco Use  Smoking Status Never  Smokeless Tobacco Former   Quit date: 08/26/2011    Goals Met:  Independence with exercise equipment Exercise tolerated well No report of concerns or symptoms today Strength training completed today  Goals Unmet:  Not Applicable  Comments: Pt able to follow exercise prescription today without complaint.  Will continue to monitor for progression.    Dr. Bethann Punches is Medical Director for [facility] Cardiac Rehabilitation.  Dr. Vida Rigger is Medical Director for [facility] Pulmonary Rehabilitation.

## 2023-06-15 NOTE — Progress Notes (Signed)
Cardiac Individual Treatment Plan  Patient Details  Name: Michael Mckenzie MRN: [id number] Date of Birth: 04/18/1961 Referring Provider:   Flowsheet Row Cardiac Rehab from 04/18/2023 in [facility] Cardiac and Pulmonary Rehab  Referring Provider Dr. Sampson Goon       Initial Encounter Date:  Flowsheet Row Cardiac Rehab from 04/18/2023 in [facility] Cardiac and Pulmonary Rehab  Date 04/18/23       Visit Diagnosis: S/P mitral valve replacement  Patient's Home Medications on Admission:  Current Outpatient Medications:    amiodarone (PACERONE) 200 MG tablet, Take 200 mg by mouth daily., Disp: , Rfl:    aspirin 81 MG chewable tablet, Chew 81 mg by mouth daily., Disp: , Rfl:    atorvastatin (LIPITOR) 40 MG tablet, Take 40 mg by mouth daily., Disp: , Rfl:    Calcium Citrate-Vitamin D (CALCIUM CITRATE + D PO), Take by mouth daily., Disp: , Rfl:    cyclobenzaprine (FLEXERIL) 5 MG tablet, Take 1-2 tablets 3 times daily as needed, Disp: 20 tablet, Rfl: 0   furosemide (LASIX) 20 MG tablet, Take 20 mg by mouth daily., Disp: , Rfl:    GLUCOSAMINE-CHONDROITIN PO, Take by mouth daily., Disp: , Rfl:    Multiple Vitamin (MULTIVITAMIN ADULT PO), Take 1 tablet by mouth daily., Disp: , Rfl:    Omega-3 Fatty Acids (FISH OIL PO), Take by mouth., Disp: , Rfl:    Potassium 99 MG TABS, Take 1 tablet by mouth daily., Disp: , Rfl:   Past Medical History: Past Medical History:  Diagnosis Date   Sleep apnea     Tobacco Use: Social History   Tobacco Use  Smoking Status Never  Smokeless Tobacco Former   Quit date: 08/26/2011    Labs: Review Flowsheet        No data to display           Exercise Target Goals: Exercise Program Goal: Individual exercise prescription set using results from initial 6 min walk test and THRR while considering  patient's activity barriers and safety.   Exercise Prescription Goal: Initial exercise prescription builds to 30-45 minutes a day of aerobic activity, 2-3 days per week.   Home exercise guidelines will be given to patient during program as part of exercise prescription that the participant will acknowledge.   Education: Aerobic Exercise: - Group verbal and visual presentation on the components of exercise prescription. Introduces F.I.T.T principle from ACSM for exercise prescriptions.  Reviews F.I.T.T. principles of aerobic exercise including progression. Written material given at graduation. Flowsheet Row Cardiac Rehab from 05/25/2023 in [facility] Cardiac and Pulmonary Rehab  Date 05/25/23  Educator NT  Instruction Review Code 1- Verbalizes Understanding       Education: Resistance Exercise: - Group verbal and visual presentation on the components of exercise prescription. Introduces F.I.T.T principle from ACSM for exercise prescriptions  Reviews F.I.T.T. principles of resistance exercise including progression. Written material given at graduation.    Education: Exercise & Equipment Safety: - Individual verbal instruction and demonstration of equipment use and safety with use of the equipment. Flowsheet Row Cardiac Rehab from 05/25/2023 in [facility] Cardiac and Pulmonary Rehab  Date 04/18/23  Educator [facility]  Instruction Review Code 1- Verbalizes Understanding       Education: Exercise Physiology & General Exercise Guidelines: - Group verbal and written instruction with models to review the exercise physiology of the cardiovascular system and associated critical values. Provides general exercise guidelines with specific guidelines to those with heart or lung disease.  Flowsheet Row Cardiac Rehab from  05/25/2023 in [facility] Cardiac and Pulmonary Rehab  Date 05/18/23  Educator [place]  Instruction Review Code 1- [employer] Understanding       Education: Flexibility, Balance, Mind/Body Relaxation: - Group verbal and visual presentation with interactive activity on the components of exercise prescription. Introduces F.I.T.T principle from ACSM for exercise prescriptions.  Reviews F.I.T.T. principles of flexibility and balance exercise training including progression. Also discusses the mind body connection.  Reviews various relaxation techniques to help reduce and manage stress (i.e. Deep breathing, progressive muscle relaxation, and visualization). Balance handout provided to take home. Written material given at graduation.   Activity Barriers & Risk Stratification:  Activity Barriers & Cardiac Risk Stratification - 04/18/23 1718       Activity Barriers & Cardiac Risk Stratification   Activity Barriers Joint Problems             6 Minute Walk:  6 Minute Walk     Row Name 04/18/23 1717         6 Minute Walk   Phase Initial     Distance 1540 feet     Walk Time 6 minutes     # of Rest Breaks 0     MPH 2.92     METS 4.23     RPE 10     Perceived Dyspnea  0     VO2 Peak 14.8     Symptoms No     Resting HR 85 bpm     Resting BP 132/80     Resting Oxygen Saturation  97 %     Exercise Oxygen Saturation  during 6 min walk 99 %     Max Ex. HR 119 bpm     Max Ex. BP 162/82     2 Minute Post BP 132/80              Oxygen Initial Assessment:   Oxygen Re-Evaluation:   Oxygen Discharge (Final Oxygen Re-Evaluation):   Initial Exercise Prescription:  Initial Exercise Prescription - 04/18/23 1700       Date of Initial Exercise RX and Referring Provider   Date 04/18/23    Referring Provider Dr. Sampson Goon      Oxygen   Maintain Oxygen Saturation 88% or higher      Treadmill   MPH 3    Grade 1    Minutes 15    METs 3.71      Recumbant Elliptical   Level 2    RPM 50    Minutes 15    METs 4.23      REL-XR   Level 2    Speed 50    Minutes 15    METs 4.23      Prescription Details   Frequency (times per week) 3    Duration Progress to 30 minutes of continuous aerobic without signs/symptoms of physical distress      Intensity   THRR 40-80% of Max Heartrate 113-142    Ratings of Perceived Exertion 11-13    Perceived  Dyspnea 0-4      Progression   Progression Continue to progress workloads to maintain intensity without signs/symptoms of physical distress.      Resistance Training   Training Prescription Yes    Weight 3    Reps 10-15             Perform Capillary Blood Glucose checks as needed.  Exercise Prescription Changes:   Exercise Prescription Changes     Row Name 04/18/23 1700 05/04/23  1100 05/13/23 0800 05/17/23 1500 05/31/23 1200     Response to Exercise   Blood Pressure (Admit) 132/80 96/58 -- 112/70 122/64   Blood Pressure (Exercise) 162/82 156/82 -- 132/58 --   Blood Pressure (Exit) 132/82 102/60 -- 130/70 106/60   Heart Rate (Admit) 85 bpm 101 bpm -- 101 bpm 95 bpm   Heart Rate (Exercise) 119 bpm 136 bpm -- 139 bpm 141 bpm   Heart Rate (Exit) 99 bpm 111 bpm -- 102 bpm 116 bpm   Oxygen Saturation (Admit) 97 % -- -- -- --   Oxygen Saturation (Exercise) 99 % -- -- -- --   Oxygen Saturation (Exit) 99 % -- -- -- --   Rating of Perceived Exertion (Exercise) 10 -- -- 13 14   Perceived Dyspnea (Exercise) 0 -- -- -- --   Symptoms none -- -- -- --   Comments 6 MWT results -- -- -- --   Duration -- Progress to 30 minutes of  aerobic without signs/symptoms of physical distress -- Progress to 30 minutes of  aerobic without signs/symptoms of physical distress Progress to 30 minutes of  aerobic without signs/symptoms of physical distress   Intensity -- THRR unchanged -- THRR unchanged THRR unchanged     Progression   Progression -- Continue to progress workloads to maintain intensity without signs/symptoms of physical distress. -- Continue to progress workloads to maintain intensity without signs/symptoms of physical distress. Continue to progress workloads to maintain intensity without signs/symptoms of physical distress.   Average METs -- -- -- 3.6 4.1     Resistance Training   Training Prescription -- Yes -- Yes Yes   Weight -- 3 -- 3 5   Reps -- 10-15 -- 10-15 10-15      Treadmill   MPH -- 2.4 -- 3 2.6   Grade -- 3 -- 2 4.5   Minutes -- 15 -- 15 15   METs -- 3.83 -- 4.12 4.6     NuStep   Level -- -- -- -- 6   SPM -- -- -- -- 80   Minutes -- -- -- -- 15   METs -- -- -- -- 4.6     REL-XR   Level -- 4 -- 7 6   Minutes -- 15 -- 15 15   METs -- -- -- 3.7 4.4     T5 Nustep   Level -- 5 -- 2 --   Minutes -- 15 -- 15 --   METs -- 3.2 -- 3 --     Biostep-RELP   Level -- -- -- -- 4   SPM -- -- -- -- 50   Minutes -- -- -- -- 15   METs -- -- -- -- 4     Home Exercise Plan   Plans to continue exercise at -- -- Home (comment)  Walking at home and park, resistance bands Home (comment)  Walking at home and park, resistance bands Home (comment)  Walking at home and park, resistance bands   Frequency -- -- Add 2 additional days to program exercise sessions. Add 2 additional days to program exercise sessions. Add 2 additional days to program exercise sessions.   Initial Home Exercises Provided -- -- 05/13/23 05/13/23 05/13/23     Oxygen   Maintain Oxygen Saturation -- 88% or higher 88% or higher 88% or higher 88% or higher            Exercise Comments:   Exercise Comments     Row Name  04/22/23 4401           Exercise Comments First full day of exercise!  Patient was oriented to gym and equipment including functions, settings, policies, and procedures.  Patient's individual exercise prescription and treatment plan were reviewed.  All starting workloads were established based on the results of the 6 minute walk test done at initial orientation visit.  The plan for exercise progression was also introduced and progression will be customized based on patient's performance and goals.                Exercise Goals and Review:   Exercise Goals     Row Name 04/18/23 1748             Exercise Goals   Increase Physical Activity Yes       Intervention Provide advice, education, support and counseling about physical activity/exercise  needs.;Develop an individualized exercise prescription for aerobic and resistive training based on initial evaluation findings, risk stratification, comorbidities and participant's personal goals.       Expected Outcomes Short Term: Attend rehab on a regular basis to increase amount of physical activity.;Long Term: Add in home exercise to make exercise part of routine and to increase amount of physical activity.;Long Term: Exercising regularly at least 3-5 days a week.       Increase Strength and Stamina Yes       Intervention Provide advice, education, support and counseling about physical activity/exercise needs.;Develop an individualized exercise prescription for aerobic and resistive training based on initial evaluation findings, risk stratification, comorbidities and participant's personal goals.       Expected Outcomes Short Term: Increase workloads from initial exercise prescription for resistance, speed, and METs.;Short Term: Perform resistance training exercises routinely during rehab and add in resistance training at home;Long Term: Improve cardiorespiratory fitness, muscular endurance and strength as measured by increased METs and functional capacity ( )       Able to understand and use rate of perceived exertion (RPE) scale Yes       Intervention Provide education and explanation on how to use RPE scale       Expected Outcomes Short Term: Able to use RPE daily in rehab to express subjective intensity level;Long Term:  Able to use RPE to guide intensity level when exercising independently       Able to understand and use Dyspnea scale Yes       Intervention Provide education and explanation on how to use Dyspnea scale       Expected Outcomes Short Term: Able to use Dyspnea scale daily in rehab to express subjective sense of shortness of breath during exertion;Long Term: Able to use Dyspnea scale to guide intensity level when exercising independently       Knowledge and understanding of  Target Heart Rate Range (THRR) Yes       Intervention Provide education and explanation of THRR including how the numbers were predicted and where they are located for reference       Expected Outcomes Short Term: Able to state/look up THRR;Long Term: Able to use THRR to govern intensity when exercising independently;Short Term: Able to use daily as guideline for intensity in rehab       Able to check pulse independently Yes       Intervention Provide education and demonstration on how to check pulse in carotid and radial arteries.;Review the importance of being able to check your own pulse for safety during independent exercise  Expected Outcomes Short Term: Able to explain why pulse checking is important during independent exercise;Long Term: Able to check pulse independently and accurately       Understanding of Exercise Prescription Yes       Intervention Provide education, explanation, and written materials on patient's individual exercise prescription       Expected Outcomes Short Term: Able to explain program exercise prescription;Long Term: Able to explain home exercise prescription to exercise independently                Exercise Goals Re-Evaluation :  Exercise Goals Re-Evaluation     Row Name 04/22/23 3762 05/04/23 1154 05/13/23 0820 05/17/23 1530 05/31/23 1210     Exercise Goal Re-Evaluation   Exercise Goals Review Understanding of Exercise Prescription;Able to understand and use rate of perceived exertion (RPE) scale;Able to understand and use Dyspnea scale;Knowledge and understanding of Target Heart Rate Range (THRR) Increase Physical Activity;Increase Strength and Stamina;Understanding of Exercise Prescription Increase Physical Activity;Increase Strength and Stamina;Understanding of Exercise Prescription Increase Physical Activity;Increase Strength and Stamina;Understanding of Exercise Prescription Increase Physical Activity;Understanding of Exercise Prescription;Increase  Strength and Stamina   Comments Reviewed RPE  and dyspnea scale, THR and program prescription with pt today.  Pt voiced understanding and was given a copy of goals to take home. Michael Mckenzie has started the program on 7/12. He is pprogressing well. Increasing workloads . He is at level 4 on the REXR, 2.4/3 on the TM and up to level 5 on the T4 Nustep. His RPE are at 13 and he is using 3 lb handweights. Will continue to monitor his exercise progression. Reviewed home exercise with pt today.  Pt plans to walk at home and the park as well as use resistance bands for exercise.  Reviewed THR, pulse, RPE, sign and symptoms, pulse oximetery and when to call 911 or MD.  Also discussed weather considerations and indoor options.  Pt voiced understanding. Michael Mckenzie to do well in rehab. He has increased his treadmill workload and XR level to 7. He has stayed consistent with 3lbs for resistance training. We will continue to monitor his progress in the program. Michael Mckenzie Mckenzie to do well in rehab He has increased his treadmill workload and increased to 5lb hand weights for resistance training. We will continue to monitor (his/her) progress in the program.   Expected Outcomes Short: Use RPE daily to regulate intensity. Long: Follow program prescription in THR. GBT:DVVOHYWV workloads as tolerated with goal of increased stamina and strength.   LTG: Continued exercise progression as tolerated during program and after discharge. Short: Begin walking at home on days away from rehab. Long: Continue exercise to improve strength and stamina. Short: Continue to progressively increase treadmill and XR workload. Try increasing hand weights to 4lbs for resistance training. Long: Continue exercise to improve strength and stamina. Short: Continue to progressively increase treadmill workload and progressively increase hand weights. Long: Continue exercise to improve strength and stamina.            Discharge Exercise Prescription (Final  Exercise Prescription Changes):  Exercise Prescription Changes - 05/31/23 1200       Response to Exercise   Blood Pressure (Admit) 122/64    Blood Pressure (Exit) 106/60    Heart Rate (Admit) 95 bpm    Heart Rate (Exercise) 141 bpm    Heart Rate (Exit) 116 bpm    Rating of Perceived Exertion (Exercise) 14    Duration Progress to 30 minutes of  aerobic without signs/symptoms of  physical distress    Intensity THRR unchanged      Progression   Progression Continue to progress workloads to maintain intensity without signs/symptoms of physical distress.    Average METs 4.1      Resistance Training   Training Prescription Yes    Weight 5    Reps 10-15      Treadmill   MPH 2.6    Grade 4.5    Minutes 15    METs 4.6      NuStep   Level 6    SPM 80    Minutes 15    METs 4.6      REL-XR   Level 6    Minutes 15    METs 4.4      Biostep-RELP   Level 4    SPM 50    Minutes 15    METs 4      Home Exercise Plan   Plans to continue exercise at Home (comment)   Walking at home and park, resistance bands   Frequency Add 2 additional days to program exercise sessions.    Initial Home Exercises Provided 05/13/23      Oxygen   Maintain Oxygen Saturation 88% or higher             Nutrition:  Target Goals: Understanding of nutrition guidelines, daily intake of sodium 1500mg , cholesterol 200mg , calories 30% from fat and 7% or less from saturated fats, daily to have 5 or more servings of fruits and vegetables.  Education: All About Nutrition: -Group instruction provided by verbal, written material, interactive activities, discussions, models, and posters to present general guidelines for heart healthy nutrition including fat, fiber, MyPlate, the role of sodium in heart healthy nutrition, utilization of the nutrition label, and utilization of this knowledge for meal planning. Follow up email sent as well. Written material given at graduation. Flowsheet Row Cardiac Rehab from  05/25/2023 in [facility] Cardiac and Pulmonary Rehab  Education need identified 04/18/23       Biometrics:  Pre Biometrics - 04/18/23 1748       Pre Biometrics   Height 5\' 9"  (1.753 m)    Weight 181 lb 11.2 oz (82.4 kg)    Waist Circumference 38 inches    Hip Circumference 42 inches    Waist to Hip Ratio 0.9 %    BMI (Calculated) 26.82    Single Leg Stand 21.22 seconds              Nutrition Therapy Plan and Nutrition Goals:  Nutrition Therapy & Goals - 04/25/23 1041       Nutrition Therapy   Diet Cardiac, low Na    Protein (specify units) 90    Fiber 30 grams    Whole Grain Foods 3 servings    Saturated Fats 1.5 max. grams    Fruits and Vegetables 5 servings/day    Sodium 2 grams      Personal Nutrition Goals   Nutrition Goal Drink 2 bottles of water per day (bring water on commute)    Personal Goal #2 Pair a protein and carb at meals    Personal Goal #3 Be mindful of routine and consistency    Comments Patient has good family support, his wife is also following nutrition guidance for cancer recovery. Neither eat large portions and both like fruits and veggies. They have been staying away from carb heavy foods and watching their sugary foods. All good choices, encouraged them to continue to be mindful  of food choices and allow for moderation of comfort foods from time to time. Educated on [employer] handout, types of fats, sources, and how to read food labels. Reviewed sodium content of some of his favorite foods, frozen meals, and snacks. He is drinking 1 bottle of water per day. Talked about ways to increase, such as bringing a bottle with him on his commutes to work. He reports snacking during his drive home after long shifts. His routine is inconsistent with busy work and life schedules. 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 Set follow up appointment  in ~54month to work together on goals after going back to work and switching to day shift.      Intervention Plan   Intervention Prescribe, educate and counsel regarding individualized specific dietary modifications aiming towards targeted core components such as weight, hypertension, lipid management, diabetes, heart failure and other comorbidities.;Nutrition handout(s) given to patient.    Expected Outcomes Short Term Goal: Understand basic principles of dietary content, such as calories, fat, sodium, cholesterol and nutrients.;Short Term Goal: A plan has been developed with personal nutrition goals set during dietitian appointment.;Long Term Goal: Adherence to prescribed nutrition plan.             Nutrition Assessments:  MEDIFICTS Score Key: ?70 Need to make dietary changes  40-70 Heart Healthy Diet ? 40 Therapeutic Level Cholesterol Diet  Flowsheet Row Cardiac Rehab from 04/18/2023 in [facility] Cardiac and Pulmonary Rehab  Picture Your Plate Total Score on Admission 58      Picture Your Plate Scores: <09 Unhealthy dietary pattern with much room for improvement. 41-50 Dietary pattern unlikely to meet recommendations for good health and room for improvement. 51-60 More healthful dietary pattern, with some room for improvement.  >60 Healthy dietary pattern, although there may be some specific behaviors that could be improved.    Nutrition Goals Re-Evaluation:  Nutrition Goals Re-Evaluation     Row Name 05/13/23 0803             Goals   Comment Michael Mckenzie states that he is doing well with the dietary guidelines given to him by the RD. He states that he has been making adjustments such as drinking more water, eating more vegetables, and paying attention to his sodium intake. He has also been trying to improve more protein in his diet as recommended by the RD.       Expected Outcome Short: Continue to monitor sodium intake. Long: Continue heart-healthy eating patterns.                 Nutrition Goals Discharge (Final Nutrition Goals Re-Evaluation):  Nutrition Goals Re-Evaluation - 05/13/23 0803       Goals   Comment Michael Mckenzie states that he is doing well with the dietary guidelines given to him by the RD. He states that he has been making adjustments such as drinking more water, eating more vegetables, and paying attention to his sodium intake. He has also been trying to improve more protein in his diet as recommended by the RD.    Expected Outcome Short: Continue to monitor sodium intake. Long: Continue heart-healthy eating patterns.             Psychosocial: Target Goals: Acknowledge presence or absence of significant depression and/or stress, maximize coping skills, provide positive support system. Participant is able to verbalize types and  ability to use techniques and skills needed for reducing stress and depression.   Education: Stress, Anxiety, and Depression - Group verbal and visual presentation to define topics covered.  Reviews how body is impacted by stress, anxiety, and depression.  Also discusses healthy ways to reduce stress and to treat/manage anxiety and depression.  Written material given at graduation. Flowsheet Row Cardiac Rehab from 05/25/2023 in [facility] Cardiac and Pulmonary Rehab  Date 05/11/23  Educator SB  Instruction Review Code 1- [employer] Understanding       Education: Sleep Hygiene -Provides group verbal and written instruction about how sleep can affect your health.  Define sleep hygiene, discuss sleep cycles and impact of sleep habits. Review good sleep hygiene tips.    Initial Review & Psychosocial Screening:  Initial Psych Review & Screening - 04/12/23 1318       Initial Review   Current issues with Current Stress Concerns    Comments healing process, off routine      Family Dynamics   Good Support System? Yes   wife, daughter     Barriers   Psychosocial barriers to participate in program There are no identifiable barriers  or psychosocial needs.;The patient should benefit from training in stress management and relaxation.      Screening Interventions   Interventions Encouraged to exercise;Provide feedback about the scores to participant;To provide support and resources with identified psychosocial needs    Expected Outcomes Short Term goal: Utilizing psychosocial counselor, staff and physician to assist with identification of specific Stressors or current issues interfering with healing process. Setting desired goal for each stressor or current issue identified.;Long Term Goal: Stressors or current issues are controlled or eliminated.;Short Term goal: Identification and review with participant of any Quality of Life or Depression concerns found by scoring the questionnaire.;Long Term goal: The participant improves quality of Life and PHQ9 Scores as seen by post scores and/or verbalization of changes             Quality of Life Scores:   Quality of Life - 04/18/23 1749       Quality of Life   Select Quality of Life      Quality of Life Scores   Health/Function Pre 23.18 %    Socioeconomic Pre 24.57 %    Psych/Spiritual Pre 22 %    Family Pre 18.13 %    GLOBAL Pre 22.59 %            Scores of 19 and below usually indicate a poorer quality of life in these areas.  A difference of  2-3 points is a clinically meaningful difference.  A difference of 2-3 points in the total score of the Quality of Life Index has been associated with significant improvement in overall quality of life, self-image, physical symptoms, and general health in studies assessing change in quality of life.  PHQ-9: Review Flowsheet       04/18/2023  Depression screen PHQ 2/9  Decreased Interest 1  Down, Depressed, Hopeless 0  PHQ - 2 Score 1  Altered sleeping 0  Tired, decreased energy 1  Change in appetite 0  Feeling bad or failure about yourself  0  Trouble concentrating 2  Moving slowly or fidgety/restless 0  Suicidal  thoughts 0  PHQ-9 Score 4  Difficult doing work/chores Not difficult at all    Details           Interpretation of Total Score  Total Score Depression Severity:  1-4 = Minimal depression, 5-9 =  Mild depression, 10-14 = Moderate depression, 15-19 = Moderately severe depression, 20-27 = Severe depression   Psychosocial Evaluation and Intervention:  Psychosocial Evaluation - 04/12/23 1326       Psychosocial Evaluation & Interventions   Interventions Encouraged to exercise with the program and follow exercise prescription    Comments Michael Mckenzie is 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 He originally went to his doctor for a cough and they found the heart murmer, so needing heart surgery was a surprise. He is currently on medical leave from work with plans to return when his doctor clears him. His wife and daughter are his main support system. He mentions his wife does have other health concerns, but doing well currently. He feels like he is healing well from surgery. 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 He is hopeful with time the nerve issues recover and he is working with his dentist regarding the dental pain. He is looking forward to coming to the program to work on his exercise routine and education    Expected Outcomes Short: attend cardiac rehab for education and exercise. Long: develop and maintain positive self care habits.    Continue Psychosocial Services  Follow up required by staff             Psychosocial Re-Evaluation:  Psychosocial Re-Evaluation     Row Name 05/13/23 [phone]             Psychosocial Re-Evaluation   Current issues with Current Stress Concerns       Comments Michael Mckenzie is dealing with some stress due to the unknowns around his work situation. He wants to return to work but does not know when he will be able to  yet. He also reports havinmg a good support system at home made up by his wife and grandson. He also reports sleeping good at this time and is using his CPAP. He states to relieve stress he likes to work on wood projects in his shop and work in his yard.       Expected Outcomes Short: Continue to exercise for stress relief. Long: Maintain positive outlook.       Interventions Encouraged to attend Cardiac Rehabilitation for the exercise       Continue Psychosocial Services  Follow up required by staff                Psychosocial Discharge (Final Psychosocial Re-Evaluation):  Psychosocial Re-Evaluation - 05/13/23 0758       Psychosocial Re-Evaluation   Current issues with Current Stress Concerns    Comments Michael Mckenzie is dealing with some stress due to the unknowns around his work situation. He wants to return to work but does not know when he will be able to yet. He also reports havinmg a good support system at home made up by his wife and grandson. He also reports sleeping good at this time and is using his CPAP. He states to relieve stress he likes to work on wood projects in his shop and work in his yard.    Expected Outcomes Short: Continue to exercise for stress relief. Long: Maintain positive outlook.    Interventions Encouraged to attend Cardiac Rehabilitation for the exercise    Continue Psychosocial Services  Follow up required by staff  Vocational Rehabilitation: Provide vocational rehab assistance to qualifying candidates.   Vocational Rehab Evaluation & Intervention:  Vocational Rehab - 04/12/23 1309       Initial Vocational Rehab Evaluation & Intervention   Assessment shows need for Vocational Rehabilitation No             Education: Education Goals: Education classes will be provided on a variety of topics geared toward better understanding of heart health and risk factor modification. Participant will state understanding/return demonstration of topics  presented as noted by education test scores.  Learning Barriers/Preferences:  Learning Barriers/Preferences - 04/12/23 1308       Learning Barriers/Preferences   Learning Barriers None    Learning Preferences None             General Cardiac Education Topics:  AED/CPR: - Group verbal and written instruction with the use of models to demonstrate the basic use of the AED with the basic ABC's of resuscitation.   Anatomy and Cardiac Procedures: - Group verbal and visual presentation and models provide information about basic cardiac anatomy and function. Reviews the testing methods done to diagnose heart disease and the outcomes of the test results. Describes the treatment choices: Medical Management, Angioplasty, or Coronary Bypass Surgery for treating various heart conditions including Myocardial Infarction, Angina, Valve Disease, and Cardiac Arrhythmias.  Written material given at graduation. Flowsheet Row Cardiac Rehab from 05/25/2023 in [facility] Cardiac and Pulmonary Rehab  Education need identified 04/18/23       Medication Safety: - Group verbal and visual instruction to review commonly prescribed medications for heart and lung disease. Reviews the medication, class of the drug, and side effects. Includes the steps to properly store meds and maintain the prescription regimen.  Written material given at graduation.   Intimacy: - Group verbal instruction through game format to discuss how heart and lung disease can affect sexual intimacy. Written material given at graduation.. Flowsheet Row Cardiac Rehab from 05/25/2023 in [facility] Cardiac and Pulmonary Rehab  Date 05/25/23  Educator NT  Instruction Review Code 1- Verbalizes Understanding       Know Your Numbers and Heart Failure: - Group verbal and visual instruction to discuss disease risk factors for cardiac and pulmonary disease and treatment options.  Reviews associated critical values for Overweight/Obesity, Hypertension,  Cholesterol, and Diabetes.  Discusses basics of heart failure: signs/symptoms and treatments.  Introduces Heart Failure Zone chart for action plan for heart failure.  Written material given at graduation. Flowsheet Row Cardiac Rehab from 05/25/2023 in [facility] Cardiac and Pulmonary Rehab  Date 04/27/23  Educator SB  Instruction Review Code 1- Verbalizes Understanding       Infection Prevention: - Provides verbal and written material to individual with discussion of infection control including proper hand washing and proper equipment cleaning during exercise session. Flowsheet Row Cardiac Rehab from 05/25/2023 in [facility] Cardiac and Pulmonary Rehab  Date 04/18/23  Educator [facility]  Instruction Review Code 1- Verbalizes Understanding       Falls Prevention: - Provides verbal and written material to individual with discussion of falls prevention and safety. Flowsheet Row Cardiac Rehab from 05/25/2023 in [facility] Cardiac and Pulmonary Rehab  Date 04/18/23  Educator [facility]  Instruction Review Code 1- Verbalizes Understanding       Other: -Provides group and verbal instruction on various topics (see comments)   Knowledge Questionnaire Score:  Knowledge Questionnaire Score - 04/18/23 1749       Knowledge Questionnaire Score   Pre Score 21/26  Core Components/Risk Factors/Patient Goals at Admission:  Personal Goals and Risk Factors at Admission - 04/18/23 1751       Core Components/Risk Factors/Patient Goals on Admission    Weight Management Yes    Intervention Weight Management: Develop a combined nutrition and exercise program designed to reach desired caloric intake, while maintaining appropriate intake of nutrient and fiber, sodium and fats, and appropriate energy expenditure required for the weight goal.;Weight Management: Provide education and appropriate resources to help participant work on and attain dietary goals.    Admit Weight 181 lb 11.2 oz (82.4 kg)    Goal Weight:  Short Term 180 lb (81.6 kg)    Goal Weight: Long Term 180 lb (81.6 kg)    Expected Outcomes Short Term: Continue to assess and modify interventions until short term weight is achieved;Long Term: Adherence to nutrition and physical activity/exercise program aimed toward attainment of established weight goal;Weight Maintenance: Understanding of the daily nutrition guidelines, which includes 25-35% calories from fat, 7% or less cal from saturated fats, less than 200mg  cholesterol, less than 1.5gm of sodium, & 5 or more servings of fruits and vegetables daily;Understanding recommendations for meals to include 15-35% energy as protein, 25-35% energy from fat, 35-60% energy from carbohydrates, less than 200mg  of dietary cholesterol, 20-35 gm of total fiber daily;Understanding of distribution of calorie intake throughout the day with the consumption of 4-5 meals/snacks    Lipids Yes    Intervention Provide education and support for participant on nutrition & aerobic/resistive exercise along with prescribed medications to achieve LDL 70mg , HDL >40mg .    Expected Outcomes Short Term: Participant states understanding of desired cholesterol values and is compliant with medications prescribed. Participant is following exercise prescription and nutrition guidelines.;Long Term: Cholesterol controlled with medications as prescribed, with individualized exercise RX and with personalized nutrition plan. Value goals: LDL < 70mg , HDL > 40 mg.             Education:Diabetes - Individual verbal and written instruction to review signs/symptoms of diabetes, desired ranges of glucose level fasting, after meals and with exercise. Acknowledge that pre and post exercise glucose checks will be done for 3 sessions at entry of program.   Core Components/Risk Factors/Patient Goals Review:   Goals and Risk Factor Review     Row Name 05/13/23 0806             Core Components/Risk Factors/Patient Goals Review   Personal  Goals Review Weight Management/Obesity;Lipids       Review Santos states that he is comfortable with where his weight is right now and would like to work on maintenance. He weighed in today at 180.7 lbs. He also reports that he has been taking all of his medications as prescribed and exercising regularly to control his lipids. We will continue to check in with Michael Mckenzie regarding his risk factors.       Expected Outcomes Short: Continue to take all medications as prescribed. Long: Continue to monitor lifestyle risk factors.                Core Components/Risk Factors/Patient Goals at Discharge (Final Review):   Goals and Risk Factor Review - 05/13/23 0806       Core Components/Risk Factors/Patient Goals Review   Personal Goals Review Weight Management/Obesity;Lipids    Review Pearlie states that he is comfortable with where his weight is right now and would like to work on maintenance. He weighed in today at 180.7 lbs. He also reports that he has  been taking all of his medications as prescribed and exercising regularly to control his lipids. We will continue to check in with Michael Mckenzie regarding his risk factors.    Expected Outcomes Short: Continue to take all medications as prescribed. Long: Continue to monitor lifestyle risk factors.             ITP Comments:  ITP Comments     Row Name 04/12/23 1315 04/18/23 1717 04/19/23 1353 04/22/23 0821 05/18/23 1113   ITP Comments Initial phone call completed. Diagnosis can be found in [facility] 6/4. EP Orientation scheduled for Monday 7/8 at 3pm. Completed and gym orientation. Initial ITP created and sent for review to Dr. Bethann Punches, Medical Director. 30 Day review completed. Medical Director ITP review done, changes made as directed, and signed approval by Medical Director.    new to program First full day of exercise!  Patient was oriented to gym and equipment including functions, settings, policies, and procedures.  Patient's individual exercise  prescription and treatment plan were reviewed.  All starting workloads were established based on the results of the 6 minute walk test done at initial orientation visit.  The plan for exercise progression was also introduced and progression will be customized based on patient's performance and goals. 30 Day review completed. Medical Director ITP review done, changes made as directed, and signed approval by Medical Director.   new to program    Row Name 06/15/23 0924           ITP Comments 30 Day review completed. Medical Director ITP review done, changes made as directed, and signed approval by Medical Director.                Comments:

## 2023-06-16 ENCOUNTER — Encounter: Payer: 59 | Admitting: *Deleted

## 2023-06-16 DIAGNOSIS — Z952 Presence of prosthetic heart valve: Secondary | ICD-10-CM | POA: Diagnosis not present

## 2023-06-16 NOTE — Progress Notes (Signed)
Daily Session Note  Patient Details  Name: Michael Mckenzie MRN: [id number] Date of Birth: Sep 24, 1961 Referring Provider:   Flowsheet Row Cardiac Rehab from 04/18/2023 in [facility] Cardiac and Pulmonary Rehab  Referring Provider Dr. Sampson Goon       Encounter Date: 06/16/2023  Check In:  Session Check In - 06/16/23 0757       Check-In   Supervising physician immediately available to respond to emergencies See telemetry face sheet for immediately available ER MD    Location [facility]-Cardiac & Pulmonary Rehab    Staff Present Ronette Deter, BS, Exercise Physiologist;Joseph Shelbie Proctor, RN, [place]    Virtual Visit No    Medication changes reported     No    Fall or balance concerns reported    No    Warm-up and Cool-down Performed on first and last piece of equipment    Resistance Training Performed Yes    VAD Patient? No    PAD/SET Patient? No      Pain Assessment   Currently in Pain? No/denies                Social History   Tobacco Use  Smoking Status Never  Smokeless Tobacco Former   Quit date: 08/26/2011    Goals Met:  Independence with exercise equipment Exercise tolerated well No report of concerns or symptoms today Strength training completed today  Goals Unmet:  Not Applicable  Comments: Pt able to follow exercise prescription today without complaint.  Will continue to monitor for progression.    Dr. Bethann Punches is Medical Director for [facility] Cardiac Rehabilitation.  Dr. Vida Rigger is Medical Director for [facility] Pulmonary Rehabilitation.

## 2023-06-20 ENCOUNTER — Encounter: Payer: 59 | Admitting: *Deleted

## 2023-06-20 DIAGNOSIS — Z952 Presence of prosthetic heart valve: Secondary | ICD-10-CM

## 2023-06-20 NOTE — Progress Notes (Signed)
Daily Session Note  Patient Details  Name: Michael Mckenzie MRN: [id number] Date of Birth: [birth year] Referring Provider:   Flowsheet Row Cardiac Rehab from 04/18/2023 in [facility] Cardiac and Pulmonary Rehab  Referring Provider Dr. Sampson Goon       Encounter Date: 06/20/2023  Check In:  Session Check In - 06/20/23 0836       Check-In   Supervising physician immediately available to respond to emergencies See telemetry face sheet for immediately available ER MD    Location [facility]-Cardiac & Pulmonary Rehab    Staff Present Tommye Standard, BS, ACSM CEP, Exercise Physiologist;Joseph [place], [place];Cora Collum, RN, BSN, CCRP    Virtual Visit No    Medication changes reported     No    Fall or balance concerns reported    No    Warm-up and Cool-down Performed on first and last piece of equipment    Resistance Training Performed Yes    VAD Patient? No    PAD/SET Patient? No      Pain Assessment   Currently in Pain? No/denies                Social History   Tobacco Use  Smoking Status Never  Smokeless Tobacco Former   Quit date: 08/26/2011    Goals Met:  Independence with exercise equipment Exercise tolerated well No report of concerns or symptoms today  Goals Unmet:  Not Applicable  Comments: Pt able to follow exercise prescription today without complaint.  Will continue to monitor for progression.    Dr. Bethann Punches is Medical Director for [facility] Cardiac Rehabilitation.  Dr. Vida Rigger is Medical Director for [facility] Pulmonary Rehabilitation.

## 2023-06-21 ENCOUNTER — Ambulatory Visit: Payer: 59

## 2023-06-29 ENCOUNTER — Encounter: Payer: 59 | Admitting: *Deleted

## 2023-06-29 DIAGNOSIS — Z952 Presence of prosthetic heart valve: Secondary | ICD-10-CM | POA: Diagnosis not present

## 2023-06-29 NOTE — Progress Notes (Signed)
Daily Session Note  Patient Details  Name: Michael Mckenzie MRN: [id number] Date of Birth: 01-17-1961 Referring Provider:   Flowsheet Row Cardiac Rehab from 04/18/2023 in [facility] Cardiac and Pulmonary Rehab  Referring Provider Dr. Sampson Goon       Encounter Date: 06/29/2023  Check In:  Session Check In - 06/29/23 0821       Check-In   Supervising physician immediately available to respond to emergencies See telemetry face sheet for immediately available ER MD    Location [facility]-Cardiac & Pulmonary Rehab    Staff Present Rory Percy, MS, Exercise Physiologist;Joseph Shelbie Proctor, RN, ADN    Virtual Visit No    Medication changes reported     No    Fall or balance concerns reported    No    Warm-up and Cool-down Performed on first and last piece of equipment    Resistance Training Performed Yes    VAD Patient? No    PAD/SET Patient? No      Pain Assessment   Currently in Pain? No/denies                Social History   Tobacco Use  Smoking Status Never  Smokeless Tobacco Former   Quit date: 08/26/2011    Goals Met:  Independence with exercise equipment Exercise tolerated well No report of concerns or symptoms today Strength training completed today  Goals Unmet:  Not Applicable  Comments: Pt able to follow exercise prescription today without complaint.  Will continue to monitor for progression.    Dr. Bethann Punches is Medical Director for [facility] Cardiac Rehabilitation.  Dr. Vida Rigger is Medical Director for [facility] Pulmonary Rehabilitation.

## 2023-06-30 ENCOUNTER — Encounter: Payer: 59 | Admitting: *Deleted

## 2023-06-30 DIAGNOSIS — Z952 Presence of prosthetic heart valve: Secondary | ICD-10-CM | POA: Diagnosis not present

## 2023-06-30 NOTE — Progress Notes (Signed)
Daily Session Note  Patient Details  Name: NIKAI SCHREYER MRN: [id number] Date of Birth: [birth year] Referring Provider:   Flowsheet Row Cardiac Rehab from 04/18/2023 in [facility] Cardiac and Pulmonary Rehab  Referring Provider Dr. Sampson Goon       Encounter Date: 06/30/2023  Check In:  Session Check In - 06/30/23 0811       Check-In   Supervising physician immediately available to respond to emergencies See telemetry face sheet for immediately available ER MD    Location [facility]-Cardiac & Pulmonary Rehab    Staff Present Lanny Hurst, RN, ADN;Maxon Conetta BS, , Exercise Physiologist;Joseph [place], [place]    Virtual Visit No    Medication changes reported     No    Fall or balance concerns reported    No    Warm-up and Cool-down Performed on first and last piece of equipment    Resistance Training Performed Yes    VAD Patient? No    PAD/SET Patient? No      Pain Assessment   Currently in Pain? No/denies                Social History   Tobacco Use  Smoking Status Never  Smokeless Tobacco Former   Quit date: 08/26/2011    Goals Met:  Independence with exercise equipment Exercise tolerated well No report of concerns or symptoms today Strength training completed today  Goals Unmet:  Not Applicable  Comments: Pt able to follow exercise prescription today without complaint.  Will continue to monitor for progression.    Dr. Bethann Punches is Medical Director for [facility] Cardiac Rehabilitation.  Dr. Vida Rigger is Medical Director for [facility] Pulmonary Rehabilitation.

## 2023-07-04 ENCOUNTER — Encounter: Payer: 59 | Admitting: *Deleted

## 2023-07-04 DIAGNOSIS — Z952 Presence of prosthetic heart valve: Secondary | ICD-10-CM | POA: Diagnosis not present

## 2023-07-04 NOTE — Progress Notes (Signed)
Daily Session Note  Patient Details  Name: CYRIE GAMARRA MRN: [id number] Date of Birth: 1961/06/11 Referring Provider:   Flowsheet Row Cardiac Rehab from 04/18/2023 in [facility] Cardiac and Pulmonary Rehab  Referring Provider Dr. Sampson Goon       Encounter Date: 07/04/2023  Check In:  Session Check In - 07/04/23 0801       Check-In   Supervising physician immediately available to respond to emergencies See telemetry face sheet for immediately available ER MD    Location [facility]-Cardiac & Pulmonary Rehab    Staff Present Elige Ko, Algernon Huxley, BS, ACSM CEP, Exercise Physiologist;Gabriel Conry Katrinka Blazing, RN, ADN    Virtual Visit No    Medication changes reported     No    Fall or balance concerns reported    No    Warm-up and Cool-down Performed on first and last piece of equipment    Resistance Training Performed Yes    VAD Patient? No    PAD/SET Patient? No      Pain Assessment   Currently in Pain? No/denies                Social History   Tobacco Use  Smoking Status Never  Smokeless Tobacco Former   Quit date: 08/26/2011    Goals Met:  Independence with exercise equipment Exercise tolerated well No report of concerns or symptoms today Strength training completed today  Goals Unmet:  Not Applicable  Comments: Pt able to follow exercise prescription today without complaint.  Will continue to monitor for progression.    Dr. Bethann Punches is Medical Director for [facility] Cardiac Rehabilitation.  Dr. Vida Rigger is Medical Director for [facility] Pulmonary Rehabilitation.

## 2023-07-05 ENCOUNTER — Encounter: Payer: 59 | Admitting: *Deleted

## 2023-07-05 DIAGNOSIS — Z952 Presence of prosthetic heart valve: Secondary | ICD-10-CM | POA: Diagnosis not present

## 2023-07-05 NOTE — Progress Notes (Signed)
Daily Session Note  Patient Details  Name: Michael Mckenzie MRN: [id number] Date of Birth: [birth year] Referring Provider:   Flowsheet Row Cardiac Rehab from 04/18/2023 in [facility] Cardiac and Pulmonary Rehab  Referring Provider Dr. Sampson Goon       Encounter Date: 07/05/2023  Check In:  Session Check In - 07/05/23 0835       Check-In   Supervising physician immediately available to respond to emergencies See telemetry face sheet for immediately available ER MD    Location [facility]-Cardiac & Pulmonary Rehab    Staff Present Ronette Deter, BS, Exercise Physiologist;Maxon Conetta BS, , Exercise Physiologist;Ashan Cueva Katrinka Blazing, RN, ADN    Virtual Visit No    Medication changes reported     No    Fall or balance concerns reported    No    Warm-up and Cool-down Performed on first and last piece of equipment    Resistance Training Performed Yes    VAD Patient? No    PAD/SET Patient? No      Pain Assessment   Currently in Pain? No/denies                Social History   Tobacco Use  Smoking Status Never  Smokeless Tobacco Former   Quit date: 08/26/2011    Goals Met:  Independence with exercise equipment Exercise tolerated well No report of concerns or symptoms today Strength training completed today  Goals Unmet:  Not Applicable  Comments: Pt able to follow exercise prescription today without complaint.  Will continue to monitor for progression.    Dr. Bethann Punches is Medical Director for [facility] Cardiac Rehabilitation.  Dr. Vida Rigger is Medical Director for [facility] Pulmonary Rehabilitation.

## 2023-07-06 ENCOUNTER — Ambulatory Visit: Payer: 59

## 2023-07-08 VITALS — Ht 69.0 in | Wt 182.8 lb

## 2023-07-08 DIAGNOSIS — Z952 Presence of prosthetic heart valve: Secondary | ICD-10-CM | POA: Diagnosis not present

## 2023-07-08 NOTE — Patient Instructions (Signed)
Discharge Patient Instructions  Patient Details  Name: Michael Mckenzie MRN: [id number] Date of Birth: 1961/04/24 Referring Provider:  Cathlean Marseilles, MD   Number of Visits: 62  Reason for Discharge:  Patient reached a stable level of exercise. Patient independent in their exercise. Patient has met program and personal goals.  Diagnosis:  S/P mitral valve replacement  Initial Exercise Prescription:  Initial Exercise Prescription - 04/18/23 1700       Date of Initial Exercise RX and Referring Provider   Date 04/18/23    Referring Provider Dr. Sampson Goon      Oxygen   Maintain Oxygen Saturation 88% or higher      Treadmill   MPH 3    Grade 1    Minutes 15    METs 3.71      Recumbant Elliptical   Level 2    RPM 50    Minutes 15    METs 4.23      REL-XR   Level 2    Speed 50    Minutes 15    METs 4.23      Prescription Details   Frequency (times per week) 3    Duration Progress to 30 minutes of continuous aerobic without signs/symptoms of physical distress      Intensity   THRR 40-80% of Max Heartrate 113-142    Ratings of Perceived Exertion 11-13    Perceived Dyspnea 0-4      Progression   Progression Continue to progress workloads to maintain intensity without signs/symptoms of physical distress.      Resistance Training   Training Prescription Yes    Weight 3    Reps 10-15             Discharge Exercise Prescription (Final Exercise Prescription Changes):  Exercise Prescription Changes - 06/30/23 1000       Response to Exercise   Blood Pressure (Admit) 112/64    Blood Pressure (Exit) 104/62    Heart Rate (Admit) 92 bpm    Heart Rate (Exercise) 142 bpm    Heart Rate (Exit) 116 bpm    Oxygen Saturation (Admit) 98 %    Oxygen Saturation (Exercise) 91 %    Oxygen Saturation (Exit) 98 %    Rating of Perceived Exertion (Exercise) 15    Perceived Dyspnea (Exercise) 0    Symptoms none    Duration Progress to 30 minutes of  aerobic without  signs/symptoms of physical distress    Intensity THRR unchanged      Progression   Progression Continue to progress workloads to maintain intensity without signs/symptoms of physical distress.    Average METs 4      Resistance Training   Training Prescription Yes    Weight 6    Reps 10-15      Interval Training   Interval Training No      Treadmill   MPH 2.5    Grade 3.5    Minutes 15    METs 4.12      Elliptical   Level 1    Speed 1    Minutes 15    METs 3.9      REL-XR   Level 7    Minutes 15    METs 4      Home Exercise Plan   Plans to continue exercise at Home (comment)   Walking at home and park, resistance bands   Frequency Add 2 additional days to program exercise sessions.    Initial  Home Exercises Provided 05/13/23      Oxygen   Maintain Oxygen Saturation 88% or higher             Functional Capacity:  6 Minute Walk     Row Name 04/18/23 1717 07/08/23 0807       6 Minute Walk   Phase Initial Discharge    Distance 1540 feet 1840 feet    Distance % Change -- 19 %    Distance Feet Change -- 300 ft    Walk Time 6 minutes 6 minutes    # of Rest Breaks 0 0    MPH 2.92 3.48    METS 4.23 4.55    RPE 10 13    Perceived Dyspnea  0 0    VO2 Peak 14.8 15.92    Symptoms No No    Resting HR 85 bpm 83 bpm    Resting BP 132/80 110/58    Resting Oxygen Saturation  97 % 97 %    Exercise Oxygen Saturation  during 6 min walk 99 % 93 %    Max Ex. HR 119 bpm 132 bpm    Max Ex. BP 162/82 126/62    2 Minute Post BP 132/80 --            Nutrition & Weight - Outcomes:  Pre Biometrics - 04/18/23 1748       Pre Biometrics   Height 5\' 9"  (1.753 m)    Weight 181 lb 11.2 oz (82.4 kg)    Waist Circumference 38 inches    Hip Circumference 42 inches    Waist to Hip Ratio 0.9 %    BMI (Calculated) 26.82    Single Leg Stand 21.22 seconds             Post Biometrics - 07/08/23 1610        Post  Biometrics   Height 5\' 9"  (1.753 m)    Weight 182  lb 12.8 oz (82.9 kg)    Waist Circumference 36 inches    Hip Circumference 39.5 inches    Waist to Hip Ratio 0.91 %    BMI (Calculated) 26.98    Single Leg Stand 30 seconds             Nutrition:  Nutrition Therapy & Goals - 04/25/23 1041       Nutrition Therapy   Diet Cardiac, low Na    Protein (specify units) 90    Fiber 30 grams    Whole Grain Foods 3 servings    Saturated Fats 1.5 max. grams    Fruits and Vegetables 5 servings/day    Sodium 2 grams      Personal Nutrition Goals   Nutrition Goal Drink 2 bottles of water per day (bring water on commute)    Personal Goal #2 Pair a protein and carb at meals    Personal Goal #3 Be mindful of routine and consistency    Comments Patient has good family support, his wife is also following nutrition guidance for cancer recovery. Neither eat large portions and both like fruits and veggies. They have been staying away from carb heavy foods and watching their sugary foods. All good choices, encouraged them to continue to be mindful of food choices and allow for moderation of comfort foods from time to time. Educated on [employer] handout, types of fats, sources, and how to read food labels. Reviewed sodium content of some of his favorite foods, frozen meals, and snacks. He  is drinking 1 bottle of water per day. Talked about ways to increase, such as bringing a bottle with him on his commutes to work. He reports snacking during his drive home after long shifts. His routine is inconsistent with busy work and life schedules. 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 Set follow up appointment in ~89month to work together on goals after going back to work and switching to day shift.      Intervention Plan   Intervention Prescribe, educate and counsel regarding individualized specific dietary modifications aiming towards  targeted core components such as weight, hypertension, lipid management, diabetes, heart failure and other comorbidities.;Nutrition handout(s) given to patient.    Expected Outcomes Short Term Goal: Understand basic principles of dietary content, such as calories, fat, sodium, cholesterol and nutrients.;Short Term Goal: A plan has been developed with personal nutrition goals set during dietitian appointment.;Long Term Goal: Adherence to prescribed nutrition plan.

## 2023-07-08 NOTE — Progress Notes (Signed)
Daily Session Note  Patient Details  Name: Michael Mckenzie MRN: [id number] Date of Birth: January 14, 1961 Referring Provider:   Flowsheet Row Cardiac Rehab from 04/18/2023 in [facility] Cardiac [facility] Pul[facility] Rehab  Referring Provider Dr. Sampson Goon       Encounter Date: 07/08/2023  Check In:      Social History   Tobacco Use  Smoking Status Never  Smokeless Tobacco Former   Quit date: 08/26/2011    Goals Met:  Proper associated with RPD/PD & O2 Sat Independence with exercise equipment Exercise tolerated well No report of concerns or symptoms today Strength training completed today  Goals Unmet:  Not Applicable  Comments: Pt able to follow exercise prescription today without complaint.  Will continue to monitor for progression.   Dr. Bethann Punches is Medical Director for [facility] Cardiac Rehabilitation.  Dr. Vida Rigger is Medical Director for [facility] Pulmonary Rehabilitation.

## 2023-07-13 ENCOUNTER — Encounter: Payer: 59 | Attending: Cardiovascular Disease | Admitting: *Deleted

## 2023-07-13 ENCOUNTER — Encounter: Payer: Self-pay | Admitting: *Deleted

## 2023-07-13 DIAGNOSIS — Z952 Presence of prosthetic heart valve: Secondary | ICD-10-CM | POA: Diagnosis present

## 2023-07-13 NOTE — Progress Notes (Signed)
Daily Session Note  Patient Details  Name: Michael Mckenzie MRN: [id number] Date of Birth: January 14, 1961 Referring Provider:   Flowsheet Row Cardiac Rehab from 04/18/2023 in [facility] Cardiac [facility] Rehab  Referring Provider Dr. Sampson Goon       Encounter Date: 07/13/2023  Check In:  Session Check In - 07/13/23 0743       Check-In   Supervising physician immediately available to respond to emergencies See telemetry face sheet for immediately available ER MD    Location [facility]-Cardiac & Pulmonary Rehab    Staff Present Elige Ko, RCP,RRT,BSRT;Margaret Best, MS, Exercise Physiologist;Deniro Laymon Katrinka Blazing, RN, ADN    Virtual Visit No    Medication changes reported     No    Fall or balance concerns reported    No    Warm-up and Cool-down Performed on first and last piece of equipment    Resistance Training Performed Yes    VAD Patient? No    PAD/SET Patient? No      Pain Assessment   Currently in Pain? No/denies                Social History   Tobacco Use  Smoking Status Never  Smokeless Tobacco Former   Quit date: 08/26/2011    Goals Met:  Independence with exercise equipment Exercise tolerated well No report of concerns or symptoms today Strength training completed today  Goals Unmet:  Not Applicable  Comments: Pt able to follow exercise prescription today without complaint.  Will continue to monitor for progression.    Dr. Bethann Punches is Medical Director for [facility] Cardiac Rehabilitation.  Dr. Vida Rigger is Medical Director for [facility] Pulmonary Rehabilitation.

## 2023-07-13 NOTE — Progress Notes (Signed)
Cardiac Individual Treatment Plan  Patient Details  Name: Michael Mckenzie MRN: [id number] Date of Birth: 1961/09/16 Referring Provider:   Flowsheet Row Cardiac Rehab from 04/18/2023 in [facility] Cardiac and Pulmonary Rehab  Referring Provider Dr. Sampson Goon       Initial Encounter Date:  Flowsheet Row Cardiac Rehab from 04/18/2023 in [facility] Cardiac and Pulmonary Rehab  Date 04/18/23       Visit Diagnosis: S/P mitral valve replacement  Patient's Home Medications on Admission:  Current Outpatient Medications:    amiodarone (PACERONE) 200 MG tablet, Take 200 mg by mouth daily., Disp: , Rfl:    aspirin 81 MG chewable tablet, Chew 81 mg by mouth daily., Disp: , Rfl:    atorvastatin (LIPITOR) 40 MG tablet, Take 40 mg by mouth daily., Disp: , Rfl:    Calcium Citrate-Vitamin D (CALCIUM CITRATE + D PO), Take by mouth daily., Disp: , Rfl:    cyclobenzaprine (FLEXERIL) 5 MG tablet, Take 1-2 tablets 3 times daily as needed, Disp: 20 tablet, Rfl: 0   furosemide (LASIX) 20 MG tablet, Take 20 mg by mouth daily., Disp: , Rfl:    GLUCOSAMINE-CHONDROITIN PO, Take by mouth daily., Disp: , Rfl:    Multiple Vitamin (MULTIVITAMIN ADULT PO), Take 1 tablet by mouth daily., Disp: , Rfl:    Omega-3 Fatty Acids (FISH OIL PO), Take by mouth., Disp: , Rfl:    Potassium 99 MG TABS, Take 1 tablet by mouth daily., Disp: , Rfl:   Past Medical History: Past Medical History:  Diagnosis Date   Sleep apnea     Tobacco Use: Social History   Tobacco Use  Smoking Status Never  Smokeless Tobacco Former   Quit date: 08/26/2011    Labs: Review Flowsheet        No data to display           Exercise Target Goals: Exercise Program Goal: Individual exercise prescription set using results from initial 6 min walk test and THRR while considering  patient's activity barriers and safety.   Exercise Prescription Goal: Initial exercise prescription builds to 30-45 minutes a day of aerobic activity, 2-3 days per week.   Home exercise guidelines will be given to patient during program as part of exercise prescription that the participant will acknowledge.   Education: Aerobic Exercise: - Group verbal and visual presentation on the components of exercise prescription. Introduces F.I.T.T principle from ACSM for exercise prescriptions.  Reviews F.I.T.T. principles of aerobic exercise including progression. Written material given at graduation. Flowsheet Row Cardiac Rehab from 07/13/2023 in [facility] Cardiac and Pulmonary Rehab  Date 05/25/23  Educator NT  Instruction Review Code 1- Verbalizes Understanding       Education: Resistance Exercise: - Group verbal and visual presentation on the components of exercise prescription. Introduces F.I.T.T principle from ACSM for exercise prescriptions  Reviews F.I.T.T. principles of resistance exercise including progression. Written material given at graduation.    Education: Exercise & Equipment Safety: - Individual verbal instruction and demonstration of equipment use and safety with use of the equipment. Flowsheet Row Cardiac Rehab from 07/13/2023 in [facility] Cardiac and Pulmonary Rehab  Date 04/18/23  Educator [facility]  Instruction Review Code 1- Verbalizes Understanding       Education: Exercise Physiology & General Exercise Guidelines: - Group verbal and written instruction with models to review the exercise physiology of the cardiovascular system and associated critical values. Provides general exercise guidelines with specific guidelines to those with heart or lung disease.  Flowsheet Row Cardiac Rehab from  07/13/2023 in [facility] Cardiac and Pulmonary Rehab  Date 05/18/23  Educator [place]  Instruction Review Code 1- [employer] Understanding       Education: Flexibility, Balance, Mind/Body Relaxation: - Group verbal and visual presentation with interactive activity on the components of exercise prescription. Introduces F.I.T.T principle from ACSM for exercise prescriptions.  Reviews F.I.T.T. principles of flexibility and balance exercise training including progression. Also discusses the mind body connection.  Reviews various relaxation techniques to help reduce and manage stress (i.e. Deep breathing, progressive muscle relaxation, and visualization). Balance handout provided to take home. Written material given at graduation.   Activity Barriers & Risk Stratification:  Activity Barriers & Cardiac Risk Stratification - 04/18/23 1718       Activity Barriers & Cardiac Risk Stratification   Activity Barriers Joint Problems             6 Minute Walk:  6 Minute Walk     Row Name 04/18/23 1717 07/08/23 0807       6 Minute Walk   Phase Initial Discharge    Distance 1540 feet 1840 feet    Distance % Change -- 19 %    Distance Feet Change -- 300 ft    Walk Time 6 minutes 6 minutes    # of Rest Breaks 0 0    MPH 2.92 3.48    METS 4.23 4.55    RPE 10 13    Perceived Dyspnea  0 0    VO2 Peak 14.8 15.92    Symptoms No No    Resting HR 85 bpm 83 bpm    Resting BP 132/80 110/58    Resting Oxygen Saturation  97 % 97 %    Exercise Oxygen Saturation  during 6 min walk 99 % 93 %    Max Ex. HR 119 bpm 132 bpm    Max Ex. BP 162/82 126/62    2 Minute Post BP 132/80 --             Oxygen Initial Assessment:   Oxygen Re-Evaluation:   Oxygen Discharge (Final Oxygen Re-Evaluation):   Initial Exercise Prescription:  Initial Exercise Prescription - 04/18/23 1700       Date of Initial Exercise RX and Referring Provider   Date 04/18/23    Referring Provider Dr. Sampson Goon      Oxygen   Maintain Oxygen Saturation 88% or higher      Treadmill   MPH 3    Grade 1    Minutes 15    METs 3.71      Recumbant Elliptical   Level 2    RPM 50    Minutes 15    METs 4.23      REL-XR   Level 2    Speed 50    Minutes 15    METs 4.23      Prescription Details   Frequency (times per week) 3    Duration Progress to 30 minutes of continuous aerobic  without signs/symptoms of physical distress      Intensity   THRR 40-80% of Max Heartrate 113-142    Ratings of Perceived Exertion 11-13    Perceived Dyspnea 0-4      Progression   Progression Continue to progress workloads to maintain intensity without signs/symptoms of physical distress.      Resistance Training   Training Prescription Yes    Weight 3    Reps 10-15             Perform  Capillary Blood Glucose checks as needed.  Exercise Prescription Changes:   Exercise Prescription Changes     Row Name 04/18/23 1700 05/04/23 1100 05/13/23 0800 05/17/23 1500 05/31/23 1200     Response to Exercise   Blood Pressure (Admit) 132/80 96/58 -- 112/70 122/64   Blood Pressure (Exercise) 162/82 156/82 -- 132/58 --   Blood Pressure (Exit) 132/82 102/60 -- 130/70 106/60   Heart Rate (Admit) 85 bpm 101 bpm -- 101 bpm 95 bpm   Heart Rate (Exercise) 119 bpm 136 bpm -- 139 bpm 141 bpm   Heart Rate (Exit) 99 bpm 111 bpm -- 102 bpm 116 bpm   Oxygen Saturation (Admit) 97 % -- -- -- --   Oxygen Saturation (Exercise) 99 % -- -- -- --   Oxygen Saturation (Exit) 99 % -- -- -- --   Rating of Perceived Exertion (Exercise) 10 -- -- 13 14   Perceived Dyspnea (Exercise) 0 -- -- -- --   Symptoms none -- -- -- --   Comments 6 MWT results -- -- -- --   Duration -- Progress to 30 minutes of  aerobic without signs/symptoms of physical distress -- Progress to 30 minutes of  aerobic without signs/symptoms of physical distress Progress to 30 minutes of  aerobic without signs/symptoms of physical distress   Intensity -- THRR unchanged -- THRR unchanged THRR unchanged     Progression   Progression -- Continue to progress workloads to maintain intensity without signs/symptoms of physical distress. -- Continue to progress workloads to maintain intensity without signs/symptoms of physical distress. Continue to progress workloads to maintain intensity without signs/symptoms of physical distress.   Average METs  -- -- -- 3.6 4.1     Resistance Training   Training Prescription -- Yes -- Yes Yes   Weight -- 3 -- 3 5   Reps -- 10-15 -- 10-15 10-15     Treadmill   MPH -- 2.4 -- 3 2.6   Grade -- 3 -- 2 4.5   Minutes -- 15 -- 15 15   METs -- 3.83 -- 4.12 4.6     NuStep   Level -- -- -- -- 6   SPM -- -- -- -- 80   Minutes -- -- -- -- 15   METs -- -- -- -- 4.6     REL-XR   Level -- 4 -- 7 6   Minutes -- 15 -- 15 15   METs -- -- -- 3.7 4.4     T5 Nustep   Level -- 5 -- 2 --   Minutes -- 15 -- 15 --   METs -- 3.2 -- 3 --     Biostep-RELP   Level -- -- -- -- 4   SPM -- -- -- -- 50   Minutes -- -- -- -- 15   METs -- -- -- -- 4     Home Exercise Plan   Plans to continue exercise at -- -- Home (comment)  Walking at home and park, resistance bands Home (comment)  Walking at home and park, resistance bands Home (comment)  Walking at home and park, resistance bands   Frequency -- -- Add 2 additional days to program exercise sessions. Add 2 additional days to program exercise sessions. Add 2 additional days to program exercise sessions.   Initial Home Exercises Provided -- -- 05/13/23 05/13/23 05/13/23     Oxygen   Maintain Oxygen Saturation -- 88% or higher 88% or higher 88% or higher 88% or  higher    Row Name 06/30/23 1000             Response to Exercise   Blood Pressure (Admit) 112/64       Blood Pressure (Exit) 104/62       Heart Rate (Admit) 92 bpm       Heart Rate (Exercise) 142 bpm       Heart Rate (Exit) 116 bpm       Oxygen Saturation (Admit) 98 %       Oxygen Saturation (Exercise) 91 %       Oxygen Saturation (Exit) 98 %       Rating of Perceived Exertion (Exercise) 15       Perceived Dyspnea (Exercise) 0       Symptoms none       Duration Progress to 30 minutes of  aerobic without signs/symptoms of physical distress       Intensity THRR unchanged         Progression   Progression Continue to progress workloads to maintain intensity without signs/symptoms of physical  distress.       Average METs 4         Resistance Training   Training Prescription Yes       Weight 6       Reps 10-15         Interval Training   Interval Training No         Treadmill   MPH 2.5       Grade 3.5       Minutes 15       METs 4.12         Elliptical   Level 1       Speed 1       Minutes 15       METs 3.9         REL-XR   Level 7       Minutes 15       METs 4         Home Exercise Plan   Plans to continue exercise at Home (comment)  Walking at home and park, resistance bands       Frequency Add 2 additional days to program exercise sessions.       Initial Home Exercises Provided 05/13/23         Oxygen   Maintain Oxygen Saturation 88% or higher                Exercise Comments:   Exercise Comments     Row Name 04/22/23 1324           Exercise Comments First full day of exercise!  Patient was oriented to gym and equipment including functions, settings, policies, and procedures.  Patient's individual exercise prescription and treatment plan were reviewed.  All starting workloads were established based on the results of the 6 minute walk test done at initial orientation visit.  The plan for exercise progression was also introduced and progression will be customized based on patient's performance and goals.                Exercise Goals and Review:   Exercise Goals     Row Name 04/18/23 1748             Exercise Goals   Increase Physical Activity Yes       Intervention Provide advice, education, support and counseling about physical activity/exercise needs.;Develop an individualized exercise prescription  for aerobic and resistive training based on initial evaluation findings, risk stratification, comorbidities and participant's personal goals.       Expected Outcomes Short Term: Attend rehab on a regular basis to increase amount of physical activity.;Long Term: Add in home exercise to make exercise part of routine and to increase amount  of physical activity.;Long Term: Exercising regularly at least 3-5 days a week.       Increase Strength and Stamina Yes       Intervention Provide advice, education, support and counseling about physical activity/exercise needs.;Develop an individualized exercise prescription for aerobic and resistive training based on initial evaluation findings, risk stratification, comorbidities and participant's personal goals.       Expected Outcomes Short Term: Increase workloads from initial exercise prescription for resistance, speed, and METs.;Short Term: Perform resistance training exercises routinely during rehab and add in resistance training at home;Long Term: Improve cardiorespiratory fitness, muscular endurance and strength as measured by increased METs and functional capacity ( )       Able to understand and use rate of perceived exertion (RPE) scale Yes       Intervention Provide education and explanation on how to use RPE scale       Expected Outcomes Short Term: Able to use RPE daily in rehab to express subjective intensity level;Long Term:  Able to use RPE to guide intensity level when exercising independently       Able to understand and use Dyspnea scale Yes       Intervention Provide education and explanation on how to use Dyspnea scale       Expected Outcomes Short Term: Able to use Dyspnea scale daily in rehab to express subjective sense of shortness of breath during exertion;Long Term: Able to use Dyspnea scale to guide intensity level when exercising independently       Knowledge and understanding of Target Heart Rate Range (THRR) Yes       Intervention Provide education and explanation of THRR including how the numbers were predicted and where they are located for reference       Expected Outcomes Short Term: Able to state/look up THRR;Long Term: Able to use THRR to govern intensity when exercising independently;Short Term: Able to use daily as guideline for intensity in rehab       Able  to check pulse independently Yes       Intervention Provide education and demonstration on how to check pulse in carotid and radial arteries.;Review the importance of being able to check your own pulse for safety during independent exercise       Expected Outcomes Short Term: Able to explain why pulse checking is important during independent exercise;Long Term: Able to check pulse independently and accurately       Understanding of Exercise Prescription Yes       Intervention Provide education, explanation, and written materials on patient's individual exercise prescription       Expected Outcomes Short Term: Able to explain program exercise prescription;Long Term: Able to explain home exercise prescription to exercise independently                Exercise Goals Re-Evaluation :  Exercise Goals Re-Evaluation     Row Name 04/22/23 1610 05/04/23 1154 05/13/23 0820 05/17/23 1530 05/31/23 1210     Exercise Goal Re-Evaluation   Exercise Goals Review Understanding of Exercise Prescription;Able to understand and use rate of perceived exertion (RPE) scale;Able to understand and use Dyspnea scale;Knowledge and understanding of Target Heart Rate  Range (THRR) Increase Physical Activity;Increase Strength and Stamina;Understanding of Exercise Prescription Increase Physical Activity;Increase Strength and Stamina;Understanding of Exercise Prescription Increase Physical Activity;Increase Strength and Stamina;Understanding of Exercise Prescription Increase Physical Activity;Understanding of Exercise Prescription;Increase Strength and Stamina   Comments Reviewed RPE  and dyspnea scale, THR and program prescription with pt today.  Pt voiced understanding and was given a copy of goals to take home. Michael Mckenzie has started the program on 7/12. He is pprogressing well. Increasing workloads . He is at level 4 on the REXR, 2.4/3 on the TM and up to level 5 on the T4 Nustep. His RPE are at 13 and he is using 3 lb handweights.  Will continue to monitor his exercise progression. Reviewed home exercise with pt today.  Pt plans to walk at home and the park as well as use resistance bands for exercise.  Reviewed THR, pulse, RPE, sign and symptoms, pulse oximetery and when to call 911 or MD.  Also discussed weather considerations and indoor options.  Pt voiced understanding. Michael Mckenzie continues to do well in rehab. He has increased his treadmill workload and XR level to 7. He has stayed consistent with 3lbs for resistance training. We will continue to monitor his progress in the program. Michael Mckenzie continues to do well in rehab He has increased his treadmill workload and increased to 5lb hand weights for resistance training. We will continue to monitor (his/her) progress in the program.   Expected Outcomes Short: Use RPE daily to regulate intensity. Long: Follow program prescription in THR. UJW:[id number] workloads as tolerated with goal of increased stamina and strength.   LTG: Continued exercise progression as tolerated during program and after discharge. Short: Begin walking at home on days away from rehab. Long: Continue exercise to improve strength and stamina. Short: Continue to progressively increase treadmill and XR workload. Try increasing hand weights to 4lbs for resistance training. Long: Continue exercise to improve strength and stamina. Short: Continue to progressively increase treadmill workload and progressively increase hand weights. Long: Continue exercise to improve strength and stamina.    Row Name 06/15/23 1501 06/30/23 1056           Exercise Goal Re-Evaluation   Exercise Goals Review Increase Physical Activity;Understanding of Exercise Prescription;Increase Strength and Stamina Increase Physical Activity;Understanding of Exercise Prescription;Increase Strength and Stamina      Comments Michael Mckenzie has not attended rehab since the last review due to having wrist surgery. We will continue to monitor his progress when he returns to the  program. Michael Mckenzie is doing well and has returned to rehab. He increased his hand weights from 5 to 6lb recently. He has also added the elliptical to his exercise prescription at an intensity of level 1. We will continue to monitor his progress in the program.      Expected Outcomes Short: Return to regular attendance in rehab when appropriate. Long: Continue exercise to improve strength and stamina. Short: Continue to follow current exercise prescription, and progressively increase workloads. Long: Continue exercise to improve strength and stamina.               Discharge Exercise Prescription (Final Exercise Prescription Changes):  Exercise Prescription Changes - 06/30/23 1000       Response to Exercise   Blood Pressure (Admit) 112/64    Blood Pressure (Exit) 104/62    Heart Rate (Admit) 92 bpm    Heart Rate (Exercise) 142 bpm    Heart Rate (Exit) 116 bpm    Oxygen Saturation (Admit) 98 %  Oxygen Saturation (Exercise) 91 %    Oxygen Saturation (Exit) 98 %    Rating of Perceived Exertion (Exercise) 15    Perceived Dyspnea (Exercise) 0    Symptoms none    Duration Progress to 30 minutes of  aerobic without signs/symptoms of physical distress    Intensity THRR unchanged      Progression   Progression Continue to progress workloads to maintain intensity without signs/symptoms of physical distress.    Average METs 4      Resistance Training   Training Prescription Yes    Weight 6    Reps 10-15      Interval Training   Interval Training No      Treadmill   MPH 2.5    Grade 3.5    Minutes 15    METs 4.12      Elliptical   Level 1    Speed 1    Minutes 15    METs 3.9      REL-XR   Level 7    Minutes 15    METs 4      Home Exercise Plan   Plans to continue exercise at Home (comment)   Walking at home and park, resistance bands   Frequency Add 2 additional days to program exercise sessions.    Initial Home Exercises Provided 05/13/23      Oxygen   Maintain Oxygen  Saturation 88% or higher             Nutrition:  Target Goals: Understanding of nutrition guidelines, daily intake of sodium 1500mg , cholesterol 200mg , calories 30% from fat and 7% or less from saturated fats, daily to have 5 or more servings of fruits and vegetables.  Education: All About Nutrition: -Group instruction provided by verbal, written material, interactive activities, discussions, models, and posters to present general guidelines for heart healthy nutrition including fat, fiber, MyPlate, the role of sodium in heart healthy nutrition, utilization of the nutrition label, and utilization of this knowledge for meal planning. Follow up email sent as well. Written material given at graduation. Flowsheet Row Cardiac Rehab from 07/13/2023 in [facility] Cardiac and Pulmonary Rehab  Education need identified 04/18/23       Biometrics:  Pre Biometrics - 04/18/23 1748       Pre Biometrics   Height 5\' 9"  (1.753 m)    Weight 181 lb 11.2 oz (82.4 kg)    Waist Circumference 38 inches    Hip Circumference 42 inches    Waist to Hip Ratio 0.9 %    BMI (Calculated) 26.82    Single Leg Stand 21.22 seconds             Post Biometrics - 07/08/23 0808        Post  Biometrics   Height 5\' 9"  (1.753 m)    Weight 182 lb 12.8 oz (82.9 kg)    Waist Circumference 36 inches    Hip Circumference 39.5 inches    Waist to Hip Ratio 0.91 %    BMI (Calculated) 26.98    Single Leg Stand 30 seconds             Nutrition Therapy Plan and Nutrition Goals:  Nutrition Therapy & Goals - 04/25/23 1041       Nutrition Therapy   Diet Cardiac, low Na    Protein (specify units) 90    Fiber 30 grams    Whole Grain Foods 3 servings    Saturated Fats 1.5 max. grams  Fruits and Vegetables 5 servings/day    Sodium 2 grams      Personal Nutrition Goals   Nutrition Goal Drink 2 bottles of water per day (bring water on commute)    Personal Goal #2 Pair a protein and carb at meals    Personal  Goal #3 Be mindful of routine and consistency    Comments Patient has good family support, his wife is also following nutrition guidance for cancer recovery. Neither eat large portions and both like fruits and veggies. They have been staying away from carb heavy foods and watching their sugary foods. All good choices, encouraged them to continue to be mindful of food choices and allow for moderation of comfort foods from time to time. Educated on [employer] handout, types of fats, sources, and how to read food labels. Reviewed sodium content of some of his favorite foods, frozen meals, and snacks. He is drinking 1 bottle of water per day. Talked about ways to increase, such as bringing a bottle with him on his commutes to work. He reports snacking during his drive home after long shifts. His routine is inconsistent with busy work and life schedules. 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 Set follow up appointment in ~72month to work together on goals after going back to work and switching to day shift.      Intervention Plan   Intervention Prescribe, educate and counsel regarding individualized specific dietary modifications aiming towards targeted core components such as weight, hypertension, lipid management, diabetes, heart failure and other comorbidities.;Nutrition handout(s) given to patient.    Expected Outcomes Short Term Goal: Understand basic principles of dietary content, such as calories, fat, sodium, cholesterol and nutrients.;Short Term Goal: A plan has been developed with personal nutrition goals set during dietitian appointment.;Long Term Goal: Adherence to prescribed nutrition plan.             Nutrition Assessments:  MEDIFICTS Score Key: >=70 Need to make dietary changes  40-70 Heart Healthy Diet <= 40 Therapeutic Level Cholesterol Diet  Flowsheet Row  Cardiac Rehab from 04/18/2023 in [facility] Cardiac and Pulmonary Rehab  Picture Your Plate Total Score on Admission 58      Picture Your Plate Scores: <40 Unhealthy dietary pattern with much room for improvement. 41-50 Dietary pattern unlikely to meet recommendations for good health and room for improvement. 51-60 More healthful dietary pattern, with some room for improvement.  >60 Healthy dietary pattern, although there may be some specific behaviors that could be improved.    Nutrition Goals Re-Evaluation:  Nutrition Goals Re-Evaluation     Row Name 05/13/23 0803 06/16/23 0818           Goals   Comment Michael Mckenzie states that he is doing well with the dietary guidelines given to him by the RD. He states that he has been making adjustments such as drinking more water, eating more vegetables, and paying attention to his sodium intake. He has also been trying to improve more protein in his diet as recommended by the RD. Michael Mckenzie is focused on what he is eating and following a heart healthy diet. He has no further questions about his diet at this time.      Expected Outcome Short: Continue to monitor sodium intake. Long: Continue heart-healthy eating patterns. Short: follow current diet. Long: adhere to a diet that pertains to him.  Nutrition Goals Discharge (Final Nutrition Goals Re-Evaluation):  Nutrition Goals Re-Evaluation - 06/16/23 0818       Goals   Comment Michael Mckenzie is focused on what he is eating and following a heart healthy diet. He has no further questions about his diet at this time.    Expected Outcome Short: follow current diet. Long: adhere to a diet that pertains to him.             Psychosocial: Target Goals: Acknowledge presence or absence of significant depression and/or stress, maximize coping skills, provide positive support system. Participant is able to verbalize types and ability to use techniques and skills needed for reducing stress and depression.    Education: Stress, Anxiety, and Depression - Group verbal and visual presentation to define topics covered.  Reviews how body is impacted by stress, anxiety, and depression.  Also discusses healthy ways to reduce stress and to treat/manage anxiety and depression.  Written material given at graduation. Flowsheet Row Cardiac Rehab from 07/13/2023 in [facility] Cardiac and Pulmonary Rehab  Date 05/11/23  Educator SB  Instruction Review Code 1- [employer] Understanding       Education: Sleep Hygiene -Provides group verbal and written instruction about how sleep can affect your health.  Define sleep hygiene, discuss sleep cycles and impact of sleep habits. Review good sleep hygiene tips.    Initial Review & Psychosocial Screening:  Initial Psych Review & Screening - 04/12/23 1318       Initial Review   Current issues with Current Stress Concerns    Comments healing process, off routine      Family Dynamics   Good Support System? Yes   wife, daughter     Barriers   Psychosocial barriers to participate in program There are no identifiable barriers or psychosocial needs.;The patient should benefit from training in stress management and relaxation.      Screening Interventions   Interventions Encouraged to exercise;Provide feedback about the scores to participant;To provide support and resources with identified psychosocial needs    Expected Outcomes Short Term goal: Utilizing psychosocial counselor, staff and physician to assist with identification of specific Stressors or current issues interfering with healing process. Setting desired goal for each stressor or current issue identified.;Long Term Goal: Stressors or current issues are controlled or eliminated.;Short Term goal: Identification and review with participant of any Quality of Life or Depression concerns found by scoring the questionnaire.;Long Term goal: The participant improves quality of Life and PHQ9 Scores as seen by post scores  and/or verbalization of changes             Quality of Life Scores:   Quality of Life - 04/18/23 1749       Quality of Life   Select Quality of Life      Quality of Life Scores   Health/Function Pre 23.18 %    Socioeconomic Pre 24.57 %    Psych/Spiritual Pre 22 %    Family Pre 18.13 %    GLOBAL Pre 22.59 %            Scores of 19 and below usually indicate a poorer quality of life in these areas.  A difference of  2-3 points is a clinically meaningful difference.  A difference of 2-3 points in the total score of the Quality of Life Index has been associated with significant improvement in overall quality of life, self-image, physical symptoms, and general health in studies assessing change in quality of life.  PHQ-9: Review Flowsheet  04/18/2023  Depression screen PHQ 2/9  Decreased Interest 1  Down, Depressed, Hopeless 0  PHQ - 2 Score 1  Altered sleeping 0  Tired, decreased energy 1  Change in appetite 0  Feeling bad or failure about yourself  0  Trouble concentrating 2  Moving slowly or fidgety/restless 0  Suicidal thoughts 0  PHQ-9 Score 4  Difficult doing work/chores Not difficult at all    Details           Interpretation of Total Score  Total Score Depression Severity:  1-4 = Minimal depression, 5-9 = Mild depression, 10-14 = Moderate depression, 15-19 = Moderately severe depression, 20-27 = Severe depression   Psychosocial Evaluation and Intervention:  Psychosocial Evaluation - 04/12/23 1326       Psychosocial Evaluation & Interventions   Interventions Encouraged to exercise with the program and follow exercise prescription    Comments Michael Mckenzie is 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 He originally went to his doctor for a cough and they found the heart murmer, so needing heart surgery was a surprise. He is currently on medical leave from work with plans to return when his doctor clears him. His wife and daughter are  his main support system. He mentions his wife does have other health concerns, but doing well currently. He feels like he is healing well from surgery. 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 He is hopeful with time the nerve issues recover and he is working with his dentist regarding the dental pain. He is looking forward to coming to the program to work on his exercise routine and education    Expected Outcomes Short: attend cardiac rehab for education and exercise. Long: develop and maintain positive self care habits.    Continue Psychosocial Services  Follow up required by staff             Psychosocial Re-Evaluation:  Psychosocial Re-Evaluation     Row Name 05/13/23 0758 06/16/23 0816           Psychosocial Re-Evaluation   Current issues with Current Stress Concerns Current Stress Concerns;None Identified      Comments Michael Mckenzie is dealing with some stress due to the unknowns around his work situation. He wants to return to work but does not know when he will be able to yet. He also reports havinmg a good support system at home made up by his wife and grandson. He also reports sleeping good at this time and is using his CPAP. He states to relieve stress he likes to work on wood projects in his shop and work in his yard. Patient reports no issues with their current mental states, sleep, stress, depression or anxiety. Will follow up with patient in a few weeks for any changes.      Expected Outcomes Short: Continue to exercise for stress relief. Long: Maintain positive outlook. Short: Continue to exercise regularly to support mental health and notify staff of any changes. Long: maintain mental health and well being through teaching of rehab or prescribed medications independently.      Interventions Encouraged to attend Cardiac Rehabilitation for the exercise Encouraged to  attend Cardiac Rehabilitation for the exercise      Continue Psychosocial Services  Follow up required by staff Follow up required by staff  Psychosocial Discharge (Final Psychosocial Re-Evaluation):  Psychosocial Re-Evaluation - 06/16/23 0816       Psychosocial Re-Evaluation   Current issues with Current Stress Concerns;None Identified    Comments Patient reports no issues with their current mental states, sleep, stress, depression or anxiety. Will follow up with patient in a few weeks for any changes.    Expected Outcomes Short: Continue to exercise regularly to support mental health and notify staff of any changes. Long: maintain mental health and well being through teaching of rehab or prescribed medications independently.    Interventions Encouraged to attend Cardiac Rehabilitation for the exercise    Continue Psychosocial Services  Follow up required by staff             Vocational Rehabilitation: Provide vocational rehab assistance to qualifying candidates.   Vocational Rehab Evaluation & Intervention:  Vocational Rehab - 04/12/23 1309       Initial Vocational Rehab Evaluation & Intervention   Assessment shows need for Vocational Rehabilitation No             Education: Education Goals: Education classes will be provided on a variety of topics geared toward better understanding of heart health and risk factor modification. Participant will state understanding/return demonstration of topics presented as noted by education test scores.  Learning Barriers/Preferences:  Learning Barriers/Preferences - 04/12/23 1308       Learning Barriers/Preferences   Learning Barriers None    Learning Preferences None             General Cardiac Education Topics:  AED/CPR: - Group verbal and written instruction with the use of models to demonstrate the basic use of the AED with the basic ABC's of resuscitation.   Anatomy and Cardiac Procedures: -  Group verbal and visual presentation and models provide information about basic cardiac anatomy and function. Reviews the testing methods done to diagnose heart disease and the outcomes of the test results. Describes the treatment choices: Medical Management, Angioplasty, or Coronary Bypass Surgery for treating various heart conditions including Myocardial Infarction, Angina, Valve Disease, and Cardiac Arrhythmias.  Written material given at graduation. Flowsheet Row Cardiac Rehab from 07/13/2023 in [facility] Cardiac and Pulmonary Rehab  Education need identified 04/18/23       Medication Safety: - Group verbal and visual instruction to review commonly prescribed medications for heart and lung disease. Reviews the medication, class of the drug, and side effects. Includes the steps to properly store meds and maintain the prescription regimen.  Written material given at graduation. Flowsheet Row Cardiac Rehab from 07/13/2023 in [facility] Cardiac and Pulmonary Rehab  Date 06/29/23  Educator SB  Instruction Review Code 1- Verbalizes Understanding       Intimacy: - Group verbal instruction through game format to discuss how heart and lung disease can affect sexual intimacy. Written material given at graduation.. Flowsheet Row Cardiac Rehab from 07/13/2023 in [facility] Cardiac and Pulmonary Rehab  Date 05/25/23  Educator NT  Instruction Review Code 1- Verbalizes Understanding       Know Your Numbers and Heart Failure: - Group verbal and visual instruction to discuss disease risk factors for cardiac and pulmonary disease and treatment options.  Reviews associated critical values for Overweight/Obesity, Hypertension, Cholesterol, and Diabetes.  Discusses basics of heart failure: signs/symptoms and treatments.  Introduces Heart Failure Zone chart for action plan for heart failure.  Written material given at graduation. Flowsheet Row Cardiac Rehab from 07/13/2023 in [facility] Cardiac and Pulmonary Rehab  Date 07/13/23   Educator SB  Instruction Review Code 1- Verbalizes Understanding       Infection Prevention: - Provides verbal and written material to individual with discussion of infection control including proper hand washing and proper equipment cleaning during exercise session. Flowsheet Row Cardiac Rehab from 07/13/2023 in [facility] Cardiac and Pulmonary Rehab  Date 04/18/23  Educator [facility]  Instruction Review Code 1- Verbalizes Understanding       Falls Prevention: - Provides verbal and written material to individual with discussion of falls prevention and safety. Flowsheet Row Cardiac Rehab from 07/13/2023 in [facility] Cardiac and Pulmonary Rehab  Date 04/18/23  Educator [facility]  Instruction Review Code 1- Verbalizes Understanding       Other: -Provides group and verbal instruction on various topics (see comments)   Knowledge Questionnaire Score:  Knowledge Questionnaire Score - 04/18/23 1749       Knowledge Questionnaire Score   Pre Score 21/26             Core Components/Risk Factors/Patient Goals at Admission:  Personal Goals and Risk Factors at Admission - 04/18/23 1751       Core Components/Risk Factors/Patient Goals on Admission    Weight Management Yes    Intervention Weight Management: Develop a combined nutrition and exercise program designed to reach desired caloric intake, while maintaining appropriate intake of nutrient and fiber, sodium and fats, and appropriate energy expenditure required for the weight goal.;Weight Management: Provide education and appropriate resources to help participant work on and attain dietary goals.    Admit Weight 181 lb 11.2 oz (82.4 kg)    Goal Weight: Short Term 180 lb (81.6 kg)    Goal Weight: Long Term 180 lb (81.6 kg)    Expected Outcomes Short Term: Continue to assess and modify interventions until short term weight is achieved;Long Term: Adherence to nutrition and physical activity/exercise program aimed toward attainment of established  weight goal;Weight Maintenance: Understanding of the daily nutrition guidelines, which includes 25-35% calories from fat, 7% or less cal from saturated fats, less than 200mg  cholesterol, less than 1.5gm of sodium, & 5 or more servings of fruits and vegetables daily;Understanding recommendations for meals to include 15-35% energy as protein, 25-35% energy from fat, 35-60% energy from carbohydrates, less than 200mg  of dietary cholesterol, 20-35 gm of total fiber daily;Understanding of distribution of calorie intake throughout the day with the consumption of 4-5 meals/snacks    Lipids Yes    Intervention Provide education and support for participant on nutrition & aerobic/resistive exercise along with prescribed medications to achieve LDL 70mg , HDL >40mg .    Expected Outcomes Short Term: Participant states understanding of desired cholesterol values and is compliant with medications prescribed. Participant is following exercise prescription and nutrition guidelines.;Long Term: Cholesterol controlled with medications as prescribed, with individualized exercise RX and with personalized nutrition plan. Value goals: LDL < 70mg , HDL > 40 mg.             Education:Diabetes - Individual verbal and written instruction to review signs/symptoms of diabetes, desired ranges of glucose level fasting, after meals and with exercise. Acknowledge that pre and post exercise glucose checks will be done for 3 sessions at entry of program.   Core Components/Risk Factors/Patient Goals Review:   Goals and Risk Factor Review     Row Name 05/13/23 0806 06/16/23 0829           Core Components/Risk Factors/Patient Goals Review   Personal Goals Review Weight Management/Obesity;Lipids Hypertension;Other      Review Michael Mckenzie states that he is comfortable  with where his weight is right now and would like to work on maintenance. He weighed in today at 180.7 lbs. He also reports that he has been taking all of his medications as  prescribed and exercising regularly to control his lipids. We will continue to check in with Michael Mckenzie regarding his risk factors. Michael Mckenzie is doing well in the program and is managing his weight. His blood pressure has been within normal limits. He has been able to increase his levels on all machines and bought a new piece of exercise equipment.      Expected Outcomes Short: Continue to take all medications as prescribed. Long: Continue to monitor lifestyle risk factors. Short: [employer]. Long: maintain exercise independently.               Core Components/Risk Factors/Patient Goals at Discharge (Final Review):   Goals and Risk Factor Review - 06/16/23 0829       Core Components/Risk Factors/Patient Goals Review   Personal Goals Review Hypertension;Other    Review Michael Mckenzie is doing well in the program and is managing his weight. His blood pressure has been within normal limits. He has been able to increase his levels on all machines and bought a new piece of exercise equipment.    Expected Outcomes Short: [employer]. Long: maintain exercise independently.             ITP Comments:  ITP Comments     Row Name 04/12/23 1315 04/18/23 1717 04/19/23 1353 04/22/23 0821 05/18/23 1113   ITP Comments Initial phone call completed. Diagnosis can be found in [facility] 6/4. EP Orientation scheduled for Monday 7/8 at 3pm. Completed and gym orientation. Initial ITP created and sent for review to Dr. Bethann Punches, Medical Director. 30 Day review completed. Medical Director ITP review done, changes made as directed, and signed approval by Medical Director.    new to program First full day of exercise!  Patient was oriented to gym and equipment including functions, settings, policies, and procedures.  Patient's individual exercise prescription and treatment plan were reviewed.  All starting workloads were established based on the results of the 6 minute walk test done at initial orientation visit.  The  plan for exercise progression was also introduced and progression will be customized based on patient's performance and goals. 30 Day review completed. Medical Director ITP review done, changes made as directed, and signed approval by Medical Director.   new to program    Row Name 06/15/23 0924 07/13/23 0949         ITP Comments 30 Day review completed. Medical Director ITP review done, changes made as directed, and signed approval by Medical Director. 30 Day review completed. Medical Director ITP review done, changes made as directed, and signed approval by Medical Director.               Comments:

## 2023-07-14 ENCOUNTER — Encounter: Payer: 59 | Admitting: *Deleted

## 2023-07-14 DIAGNOSIS — Z952 Presence of prosthetic heart valve: Secondary | ICD-10-CM

## 2023-07-14 NOTE — Progress Notes (Signed)
Daily Session Note  Patient Details  Name: Michael Mckenzie MRN: [id number] Date of Birth: September 23, 1961 Referring Provider:   Flowsheet Row Cardiac Rehab from 04/18/2023 in [facility] Cardiac and Pulmonary Rehab  Referring Provider Dr. Sampson Goon       Encounter Date: 07/14/2023  Check In:  Session Check In - 07/14/23 0816       Check-In   Supervising physician immediately available to respond to emergencies See telemetry face sheet for immediately available ER MD    Location [facility]-Cardiac & Pulmonary Rehab    Staff Present Elige Ko, RCP,RRT,BSRT;Maxon Conetta BS, , Exercise Physiologist;Ayliana Casciano Katrinka Blazing, RN, ADN    Virtual Visit No    Medication changes reported     No    Fall or balance concerns reported    No    Warm-up and Cool-down Performed on first and last piece of equipment    Resistance Training Performed Yes    VAD Patient? No    PAD/SET Patient? No      Pain Assessment   Currently in Pain? No/denies                Social History   Tobacco Use  Smoking Status Never  Smokeless Tobacco Former   Quit date: 08/26/2011    Goals Met:  Independence with exercise equipment Exercise tolerated well No report of concerns or symptoms today Strength training completed today  Goals Unmet:  Not Applicable  Comments: Pt able to follow exercise prescription today without complaint.  Will continue to monitor for progression.    Dr. Bethann Punches is Medical Director for [facility] Cardiac Rehabilitation.  Dr. Vida Rigger is Medical Director for [facility] Pulmonary Rehabilitation.

## 2023-07-22 ENCOUNTER — Encounter: Payer: 59 | Admitting: *Deleted

## 2023-07-22 DIAGNOSIS — Z952 Presence of prosthetic heart valve: Secondary | ICD-10-CM

## 2023-07-22 NOTE — Progress Notes (Signed)
Daily Session Note  Patient Details  Name: Michael Mckenzie MRN: [id number] Date of Birth: [birth year] Referring Provider:   Flowsheet Row Cardiac Rehab from 04/18/2023 in [facility] Cardiac and Pulmonary Rehab  Referring Provider Dr. Sampson Goon       Encounter Date: 07/22/2023  Check In:  Session Check In - 07/22/23 0756       Check-In   Supervising physician immediately available to respond to emergencies See telemetry face sheet for immediately available ER MD    Location [facility]-Cardiac & Pulmonary Rehab    Staff Present Bess Kinds RN, BSN;Margaret Best, MS, Exercise Physiologist;Noah Tickle, BS, Exercise Physiologist;Maxon Conetta BS, , Exercise Physiologist    Virtual Visit No    Medication changes reported     Yes    Comments States had wrist surgery and given rx for tramadol prn. Taken Monday & Tuesday (10/7&8) but not in past 3 days.    Fall or balance concerns reported    No    Warm-up and Cool-down Performed on first and last piece of equipment    Resistance Training Performed Yes    VAD Patient? No    PAD/SET Patient? No      Pain Assessment   Currently in Pain? No/denies                Social History   Tobacco Use  Smoking Status Never  Smokeless Tobacco Former   Quit date: 08/26/2011    Goals Met:  Independence with exercise equipment Exercise tolerated well No report of concerns or symptoms today Strength training completed today  Goals Unmet:  Not Applicable  Comments: Pt able to follow exercise prescription today without complaint.  Will continue to monitor for progression.    Dr. Bethann Punches is Medical Director for [facility] Cardiac Rehabilitation.  Dr. Vida Rigger is Medical Director for [facility] Pulmonary Rehabilitation.

## 2023-07-27 ENCOUNTER — Encounter: Payer: 59 | Admitting: *Deleted

## 2023-07-27 DIAGNOSIS — Z952 Presence of prosthetic heart valve: Secondary | ICD-10-CM

## 2023-07-27 NOTE — Progress Notes (Signed)
Cardiac Individual Treatment Plan  Patient Details  Name: Michael Mckenzie MRN: [id number] Date of Birth: Oct 16, 1960 Referring Provider:   Flowsheet Row Cardiac Rehab from 04/18/2023 in [facility] Cardiac and Pulmonary Rehab  Referring Provider Dr. Sampson Goon       Initial Encounter Date:  Flowsheet Row Cardiac Rehab from 04/18/2023 in [facility] Cardiac and Pulmonary Rehab  Date 04/18/23       Visit Diagnosis: S/P mitral valve replacement  Patient's Home Medications on Admission:  Current Outpatient Medications:    amiodarone (PACERONE) 200 MG tablet, Take 200 mg by mouth daily., Disp: , Rfl:    aspirin 81 MG chewable tablet, Chew 81 mg by mouth daily., Disp: , Rfl:    atorvastatin (LIPITOR) 40 MG tablet, Take 40 mg by mouth daily., Disp: , Rfl:    Calcium Citrate-Vitamin D (CALCIUM CITRATE + D PO), Take by mouth daily., Disp: , Rfl:    cyclobenzaprine (FLEXERIL) 5 MG tablet, Take 1-2 tablets 3 times daily as needed, Disp: 20 tablet, Rfl: 0   furosemide (LASIX) 20 MG tablet, Take 20 mg by mouth daily., Disp: , Rfl:    GLUCOSAMINE-CHONDROITIN PO, Take by mouth daily., Disp: , Rfl:    Multiple Vitamin (MULTIVITAMIN ADULT PO), Take 1 tablet by mouth daily., Disp: , Rfl:    Omega-3 Fatty Acids (FISH OIL PO), Take by mouth., Disp: , Rfl:    Potassium 99 MG TABS, Take 1 tablet by mouth daily., Disp: , Rfl:   Past Medical History: Past Medical History:  Diagnosis Date   Sleep apnea     Tobacco Use: Social History   Tobacco Use  Smoking Status Never  Smokeless Tobacco Former   Quit date: 08/26/2011    Labs: Review Flowsheet        No data to display           Exercise Target Goals: Exercise Program Goal: Individual exercise prescription set using results from initial 6 min walk test and THRR while considering  patient's activity barriers and safety.   Exercise Prescription Goal: Initial exercise prescription builds to 30-45 minutes a day of aerobic activity, 2-3 days per week.   Home exercise guidelines will be given to patient during program as part of exercise prescription that the participant will acknowledge.   Education: Aerobic Exercise: - Group verbal and visual presentation on the components of exercise prescription. Introduces F.I.T.T principle from ACSM for exercise prescriptions.  Reviews F.I.T.T. principles of aerobic exercise including progression. Written material given at graduation. Flowsheet Row Cardiac Rehab from 07/13/2023 in [facility] Cardiac and Pulmonary Rehab  Date 05/25/23  Educator NT  Instruction Review Code 1- Verbalizes Understanding       Education: Resistance Exercise: - Group verbal and visual presentation on the components of exercise prescription. Introduces F.I.T.T principle from ACSM for exercise prescriptions  Reviews F.I.T.T. principles of resistance exercise including progression. Written material given at graduation.    Education: Exercise & Equipment Safety: - Individual verbal instruction and demonstration of equipment use and safety with use of the equipment. Flowsheet Row Cardiac Rehab from 07/13/2023 in [facility] Cardiac and Pulmonary Rehab  Date 04/18/23  Educator [facility]  Instruction Review Code 1- Verbalizes Understanding       Education: Exercise Physiology & General Exercise Guidelines: - Group verbal and written instruction with models to review the exercise physiology of the cardiovascular system and associated critical values. Provides general exercise guidelines with specific guidelines to those with heart or lung disease.  Flowsheet Row Cardiac Rehab from  07/13/2023 in [facility] Cardiac and Pulmonary Rehab  Date 05/18/23  Educator [place]  Instruction Review Code 1- [employer] Understanding       Education: Flexibility, Balance, Mind/Body Relaxation: - Group verbal and visual presentation with interactive activity on the components of exercise prescription. Introduces F.I.T.T principle from ACSM for exercise prescriptions.  Reviews F.I.T.T. principles of flexibility and balance exercise training including progression. Also discusses the mind body connection.  Reviews various relaxation techniques to help reduce and manage stress (i.e. Deep breathing, progressive muscle relaxation, and visualization). Balance handout provided to take home. Written material given at graduation.   Activity Barriers & Risk Stratification:  Activity Barriers & Cardiac Risk Stratification - 04/18/23 1718       Activity Barriers & Cardiac Risk Stratification   Activity Barriers Joint Problems             6 Minute Walk:  6 Minute Walk     Row Name 04/18/23 1717 07/08/23 0807       6 Minute Walk   Phase Initial Discharge    Distance 1540 feet 1840 feet    Distance % Change -- 19 %    Distance Feet Change -- 300 ft    Walk Time 6 minutes 6 minutes    # of Rest Breaks 0 0    MPH 2.92 3.48    METS 4.23 4.55    RPE 10 13    Perceived Dyspnea  0 0    VO2 Peak 14.8 15.92    Symptoms No No    Resting HR 85 bpm 83 bpm    Resting BP 132/80 110/58    Resting Oxygen Saturation  97 % 97 %    Exercise Oxygen Saturation  during 6 min walk 99 % 93 %    Max Ex. HR 119 bpm 132 bpm    Max Ex. BP 162/82 126/62    2 Minute Post BP 132/80 --             Oxygen Initial Assessment:   Oxygen Re-Evaluation:   Oxygen Discharge (Final Oxygen Re-Evaluation):   Initial Exercise Prescription:  Initial Exercise Prescription - 04/18/23 1700       Date of Initial Exercise RX and Referring Provider   Date 04/18/23    Referring Provider Dr. Sampson Goon      Oxygen   Maintain Oxygen Saturation 88% or higher      Treadmill   MPH 3    Grade 1    Minutes 15    METs 3.71      Recumbant Elliptical   Level 2    RPM 50    Minutes 15    METs 4.23      REL-XR   Level 2    Speed 50    Minutes 15    METs 4.23      Prescription Details   Frequency (times per week) 3    Duration Progress to 30 minutes of continuous aerobic  without signs/symptoms of physical distress      Intensity   THRR 40-80% of Max Heartrate 113-142    Ratings of Perceived Exertion 11-13    Perceived Dyspnea 0-4      Progression   Progression Continue to progress workloads to maintain intensity without signs/symptoms of physical distress.      Resistance Training   Training Prescription Yes    Weight 3    Reps 10-15             Perform  Capillary Blood Glucose checks as needed.  Exercise Prescription Changes:   Exercise Prescription Changes     Row Name 04/18/23 1700 05/04/23 1100 05/13/23 0800 05/17/23 1500 05/31/23 1200     Response to Exercise   Blood Pressure (Admit) 132/80 96/58 -- 112/70 122/64   Blood Pressure (Exercise) 162/82 156/82 -- 132/58 --   Blood Pressure (Exit) 132/82 102/60 -- 130/70 106/60   Heart Rate (Admit) 85 bpm 101 bpm -- 101 bpm 95 bpm   Heart Rate (Exercise) 119 bpm 136 bpm -- 139 bpm 141 bpm   Heart Rate (Exit) 99 bpm 111 bpm -- 102 bpm 116 bpm   Oxygen Saturation (Admit) 97 % -- -- -- --   Oxygen Saturation (Exercise) 99 % -- -- -- --   Oxygen Saturation (Exit) 99 % -- -- -- --   Rating of Perceived Exertion (Exercise) 10 -- -- 13 14   Perceived Dyspnea (Exercise) 0 -- -- -- --   Symptoms none -- -- -- --   Comments 6 MWT results -- -- -- --   Duration -- Progress to 30 minutes of  aerobic without signs/symptoms of physical distress -- Progress to 30 minutes of  aerobic without signs/symptoms of physical distress Progress to 30 minutes of  aerobic without signs/symptoms of physical distress   Intensity -- THRR unchanged -- THRR unchanged THRR unchanged     Progression   Progression -- Continue to progress workloads to maintain intensity without signs/symptoms of physical distress. -- Continue to progress workloads to maintain intensity without signs/symptoms of physical distress. Continue to progress workloads to maintain intensity without signs/symptoms of physical distress.   Average METs  -- -- -- 3.6 4.1     Resistance Training   Training Prescription -- Yes -- Yes Yes   Weight -- 3 -- 3 5   Reps -- 10-15 -- 10-15 10-15     Treadmill   MPH -- 2.4 -- 3 2.6   Grade -- 3 -- 2 4.5   Minutes -- 15 -- 15 15   METs -- 3.83 -- 4.12 4.6     NuStep   Level -- -- -- -- 6   SPM -- -- -- -- 80   Minutes -- -- -- -- 15   METs -- -- -- -- 4.6     REL-XR   Level -- 4 -- 7 6   Minutes -- 15 -- 15 15   METs -- -- -- 3.7 4.4     T5 Nustep   Level -- 5 -- 2 --   Minutes -- 15 -- 15 --   METs -- 3.2 -- 3 --     Biostep-RELP   Level -- -- -- -- 4   SPM -- -- -- -- 50   Minutes -- -- -- -- 15   METs -- -- -- -- 4     Home Exercise Plan   Plans to continue exercise at -- -- Home (comment)  Walking at home and park, resistance bands Home (comment)  Walking at home and park, resistance bands Home (comment)  Walking at home and park, resistance bands   Frequency -- -- Add 2 additional days to program exercise sessions. Add 2 additional days to program exercise sessions. Add 2 additional days to program exercise sessions.   Initial Home Exercises Provided -- -- 05/13/23 05/13/23 05/13/23     Oxygen   Maintain Oxygen Saturation -- 88% or higher 88% or higher 88% or higher 88% or  higher    Row Name 06/30/23 1000 07/13/23 1000           Response to Exercise   Blood Pressure (Admit) 112/64 98/64      Blood Pressure (Exit) 104/62 94/60      Heart Rate (Admit) 92 bpm 87 bpm      Heart Rate (Exercise) 142 bpm 130 bpm      Heart Rate (Exit) 116 bpm 99 bpm      Oxygen Saturation (Admit) 98 % 96 %      Oxygen Saturation (Exercise) 91 % 94 %      Oxygen Saturation (Exit) 98 % 95 %      Rating of Perceived Exertion (Exercise) 15 14      Perceived Dyspnea (Exercise) 0 0      Symptoms none none      Duration Progress to 30 minutes of  aerobic without signs/symptoms of physical distress Progress to 30 minutes of  aerobic without signs/symptoms of physical distress      Intensity  THRR unchanged THRR unchanged        Progression   Progression Continue to progress workloads to maintain intensity without signs/symptoms of physical distress. Continue to progress workloads to maintain intensity without signs/symptoms of physical distress.      Average METs 4 4.5        Resistance Training   Training Prescription Yes Yes      Weight 6 6      Reps 10-15 10-15        Interval Training   Interval Training No No        Treadmill   MPH 2.5 2.6      Grade 3.5 3      Minutes 15 15      METs 4.12 4.07        NuStep   Level -- 5      Minutes -- 15      METs -- 5.1        Elliptical   Level 1 --      Speed 1 --      Minutes 15 --      METs 3.9 --        REL-XR   Level 7 8      Minutes 15 15      METs 4 7        Home Exercise Plan   Plans to continue exercise at Home (comment)  Walking at home and park, resistance bands Home (comment)  Walking at home and park, resistance bands      Frequency Add 2 additional days to program exercise sessions. Add 2 additional days to program exercise sessions.      Initial Home Exercises Provided 05/13/23 05/13/23        Oxygen   Maintain Oxygen Saturation 88% or higher 88% or higher               Exercise Comments:   Exercise Comments     Row Name 04/22/23 1610           Exercise Comments First full day of exercise!  Patient was oriented to gym and equipment including functions, settings, policies, and procedures.  Patient's individual exercise prescription and treatment plan were reviewed.  All starting workloads were established based on the results of the 6 minute walk test done at initial orientation visit.  The plan for exercise progression was also introduced and progression will be customized based on  patient's performance and goals.                Exercise Goals and Review:   Exercise Goals     Row Name 04/18/23 1748             Exercise Goals   Increase Physical Activity Yes        Intervention Provide advice, education, support and counseling about physical activity/exercise needs.;Develop an individualized exercise prescription for aerobic and resistive training based on initial evaluation findings, risk stratification, comorbidities and participant's personal goals.       Expected Outcomes Short Term: Attend rehab on a regular basis to increase amount of physical activity.;Long Term: Add in home exercise to make exercise part of routine and to increase amount of physical activity.;Long Term: Exercising regularly at least 3-5 days a week.       Increase Strength and Stamina Yes       Intervention Provide advice, education, support and counseling about physical activity/exercise needs.;Develop an individualized exercise prescription for aerobic and resistive training based on initial evaluation findings, risk stratification, comorbidities and participant's personal goals.       Expected Outcomes Short Term: Increase workloads from initial exercise prescription for resistance, speed, and METs.;Short Term: Perform resistance training exercises routinely during rehab and add in resistance training at home;Long Term: Improve cardiorespiratory fitness, muscular endurance and strength as measured by increased METs and functional capacity ( )       Able to understand and use rate of perceived exertion (RPE) scale Yes       Intervention Provide education and explanation on how to use RPE scale       Expected Outcomes Short Term: Able to use RPE daily in rehab to express subjective intensity level;Long Term:  Able to use RPE to guide intensity level when exercising independently       Able to understand and use Dyspnea scale Yes       Intervention Provide education and explanation on how to use Dyspnea scale       Expected Outcomes Short Term: Able to use Dyspnea scale daily in rehab to express subjective sense of shortness of breath during exertion;Long Term: Able to use Dyspnea scale to  guide intensity level when exercising independently       Knowledge and understanding of Target Heart Rate Range (THRR) Yes       Intervention Provide education and explanation of THRR including how the numbers were predicted and where they are located for reference       Expected Outcomes Short Term: Able to state/look up THRR;Long Term: Able to use THRR to govern intensity when exercising independently;Short Term: Able to use daily as guideline for intensity in rehab       Able to check pulse independently Yes       Intervention Provide education and demonstration on how to check pulse in carotid and radial arteries.;Review the importance of being able to check your own pulse for safety during independent exercise       Expected Outcomes Short Term: Able to explain why pulse checking is important during independent exercise;Long Term: Able to check pulse independently and accurately       Understanding of Exercise Prescription Yes       Intervention Provide education, explanation, and written materials on patient's individual exercise prescription       Expected Outcomes Short Term: Able to explain program exercise prescription;Long Term: Able to explain home exercise prescription to exercise independently  Exercise Goals Re-Evaluation :  Exercise Goals Re-Evaluation     Row Name 04/22/23 1610 05/04/23 1154 05/13/23 0820 05/17/23 1530 05/31/23 1210     Exercise Goal Re-Evaluation   Exercise Goals Review Understanding of Exercise Prescription;Able to understand and use rate of perceived exertion (RPE) scale;Able to understand and use Dyspnea scale;Knowledge and understanding of Target Heart Rate Range (THRR) Increase Physical Activity;Increase Strength and Stamina;Understanding of Exercise Prescription Increase Physical Activity;Increase Strength and Stamina;Understanding of Exercise Prescription Increase Physical Activity;Increase Strength and Stamina;Understanding of Exercise  Prescription Increase Physical Activity;Understanding of Exercise Prescription;Increase Strength and Stamina   Comments Reviewed RPE  and dyspnea scale, THR and program prescription with pt today.  Pt voiced understanding and was given a copy of goals to take home. Costas has started the program on 7/12. He is pprogressing well. Increasing workloads . He is at level 4 on the REXR, 2.4/3 on the TM and up to level 5 on the T4 Nustep. His RPE are at 13 and he is using 3 lb handweights. Will continue to monitor his exercise progression. Reviewed home exercise with pt today.  Pt plans to walk at home and the park as well as use resistance bands for exercise.  Reviewed THR, pulse, RPE, sign and symptoms, pulse oximetery and when to call 911 or MD.  Also discussed weather considerations and indoor options.  Pt voiced understanding. Jabree continues to do well in rehab. He has increased his treadmill workload and XR level to 7. He has stayed consistent with 3lbs for resistance training. We will continue to monitor his progress in the program. Prentice continues to do well in rehab He has increased his treadmill workload and increased to 5lb hand weights for resistance training. We will continue to monitor (his/her) progress in the program.   Expected Outcomes Short: Use RPE daily to regulate intensity. Long: Follow program prescription in THR. RUE:[id number] workloads as tolerated with goal of increased stamina and strength.   LTG: Continued exercise progression as tolerated during program and after discharge. Short: Begin walking at home on days away from rehab. Long: Continue exercise to improve strength and stamina. Short: Continue to progressively increase treadmill and XR workload. Try increasing hand weights to 4lbs for resistance training. Long: Continue exercise to improve strength and stamina. Short: Continue to progressively increase treadmill workload and progressively increase hand weights. Long: Continue exercise to  improve strength and stamina.    Row Name 06/15/23 1501 06/30/23 1056 07/13/23 1035         Exercise Goal Re-Evaluation   Exercise Goals Review Increase Physical Activity;Understanding of Exercise Prescription;Increase Strength and Stamina Increase Physical Activity;Understanding of Exercise Prescription;Increase Strength and Stamina Increase Physical Activity;Understanding of Exercise Prescription;Increase Strength and Stamina     Comments Rohith has not attended rehab since the last review due to having wrist surgery. We will continue to monitor his progress when he returns to the program. Brewster is doing well and has returned to rehab. He increased his hand weights from 5 to 6lb recently. He has also added the elliptical to his exercise prescription at an intensity of level 1. We will continue to monitor his progress in the program. Wilmer continues to do well in rehab. He was able to increase his level on the XR from 7 to 8. He also increased his speed on the treadmill from 2.5 mph to 2.6 mph. We will continue to monitor his progress in the program.     Expected Outcomes Short: Return to regular attendance  in rehab when appropriate. Long: Continue exercise to improve strength and stamina. Short: Continue to follow current exercise prescription, and progressively increase workloads. Long: Continue exercise to improve strength and stamina. Short: Continue to follow current exercise prescription, and progressively increase workloads. Long: Continue exercise to improve strength and stamina.              Discharge Exercise Prescription (Final Exercise Prescription Changes):  Exercise Prescription Changes - 07/13/23 1000       Response to Exercise   Blood Pressure (Admit) 98/64    Blood Pressure (Exit) 94/60    Heart Rate (Admit) 87 bpm    Heart Rate (Exercise) 130 bpm    Heart Rate (Exit) 99 bpm    Oxygen Saturation (Admit) 96 %    Oxygen Saturation (Exercise) 94 %    Oxygen Saturation (Exit)  95 %    Rating of Perceived Exertion (Exercise) 14    Perceived Dyspnea (Exercise) 0    Symptoms none    Duration Progress to 30 minutes of  aerobic without signs/symptoms of physical distress    Intensity THRR unchanged      Progression   Progression Continue to progress workloads to maintain intensity without signs/symptoms of physical distress.    Average METs 4.5      Resistance Training   Training Prescription Yes    Weight 6    Reps 10-15      Interval Training   Interval Training No      Treadmill   MPH 2.6    Grade 3    Minutes 15    METs 4.07      NuStep   Level 5    Minutes 15    METs 5.1      REL-XR   Level 8    Minutes 15    METs 7      Home Exercise Plan   Plans to continue exercise at Home (comment)   Walking at home and park, resistance bands   Frequency Add 2 additional days to program exercise sessions.    Initial Home Exercises Provided 05/13/23      Oxygen   Maintain Oxygen Saturation 88% or higher             Nutrition:  Target Goals: Understanding of nutrition guidelines, daily intake of sodium 1500mg , cholesterol 200mg , calories 30% from fat and 7% or less from saturated fats, daily to have 5 or more servings of fruits and vegetables.  Education: All About Nutrition: -Group instruction provided by verbal, written material, interactive activities, discussions, models, and posters to present general guidelines for heart healthy nutrition including fat, fiber, MyPlate, the role of sodium in heart healthy nutrition, utilization of the nutrition label, and utilization of this knowledge for meal planning. Follow up email sent as well. Written material given at graduation. Flowsheet Row Cardiac Rehab from 07/13/2023 in [facility] Cardiac and Pulmonary Rehab  Education need identified 04/18/23       Biometrics:  Pre Biometrics - 04/18/23 1748       Pre Biometrics   Height 5\' 9"  (1.753 m)    Weight 181 lb 11.2 oz (82.4 kg)    Waist  Circumference 38 inches    Hip Circumference 42 inches    Waist to Hip Ratio 0.9 %    BMI (Calculated) 26.82    Single Leg Stand 21.22 seconds             Post Biometrics - 07/08/23 1610  Post  Biometrics   Height 5\' 9"  (1.753 m)    Weight 182 lb 12.8 oz (82.9 kg)    Waist Circumference 36 inches    Hip Circumference 39.5 inches    Waist to Hip Ratio 0.91 %    BMI (Calculated) 26.98    Single Leg Stand 30 seconds             Nutrition Therapy Plan and Nutrition Goals:  Nutrition Therapy & Goals - 04/25/23 1041       Nutrition Therapy   Diet Cardiac, low Na    Protein (specify units) 90    Fiber 30 grams    Whole Grain Foods 3 servings    Saturated Fats 1.5 max. grams    Fruits and Vegetables 5 servings/day    Sodium 2 grams      Personal Nutrition Goals   Nutrition Goal Drink 2 bottles of water per day (bring water on commute)    Personal Goal #2 Pair a protein and carb at meals    Personal Goal #3 Be mindful of routine and consistency    Comments Patient has good family support, his wife is also following nutrition guidance for cancer recovery. Neither eat large portions and both like fruits and veggies. They have been staying away from carb heavy foods and watching their sugary foods. All good choices, encouraged them to continue to be mindful of food choices and allow for moderation of comfort foods from time to time. Educated on [employer] handout, types of fats, sources, and how to read food labels. Reviewed sodium content of some of his favorite foods, frozen meals, and snacks. He is drinking 1 bottle of water per day. Talked about ways to increase, such as bringing a bottle with him on his commutes to work. He reports snacking during his drive home after long shifts. His routine is inconsistent with busy work and life schedules. 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 Set follow up appointment in ~23month to work together on goals after going back to work and switching to day shift.      Intervention Plan   Intervention Prescribe, educate and counsel regarding individualized specific dietary modifications aiming towards targeted core components such as weight, hypertension, lipid management, diabetes, heart failure and other comorbidities.;Nutrition handout(s) given to patient.    Expected Outcomes Short Term Goal: Understand basic principles of dietary content, such as calories, fat, sodium, cholesterol and nutrients.;Short Term Goal: A plan has been developed with personal nutrition goals set during dietitian appointment.;Long Term Goal: Adherence to prescribed nutrition plan.             Nutrition Assessments:  MEDIFICTS Score Key: >=70 Need to make dietary changes  40-70 Heart Healthy Diet <= 40 Therapeutic Level Cholesterol Diet  Flowsheet Row Cardiac Rehab from 04/18/2023 in [facility] Cardiac and Pulmonary Rehab  Picture Your Plate Total Score on Admission 58      Picture Your Plate Scores: <16 Unhealthy dietary pattern with much room for improvement. 41-50 Dietary pattern unlikely to meet recommendations for good health and room for improvement. 51-60 More healthful dietary pattern, with some room for improvement.  >60 Healthy dietary pattern, although there may be some specific behaviors that could be improved.    Nutrition Goals Re-Evaluation:  Nutrition Goals Re-Evaluation     Row Name 05/13/23 [phone] 06/16/23 0454  Goals   Comment Ida states that he is doing well with the dietary guidelines given to him by the RD. He states that he has been making adjustments such as drinking more water, eating more vegetables, and paying attention to his sodium intake. He has also been trying to improve more protein in his diet as recommended by the RD. Shadee is focused on what he is eating and following a  heart healthy diet. He has no further questions about his diet at this time.      Expected Outcome Short: Continue to monitor sodium intake. Long: Continue heart-healthy eating patterns. Short: follow current diet. Long: adhere to a diet that pertains to him.               Nutrition Goals Discharge (Final Nutrition Goals Re-Evaluation):  Nutrition Goals Re-Evaluation - 06/16/23 0818       Goals   Comment Calan is focused on what he is eating and following a heart healthy diet. He has no further questions about his diet at this time.    Expected Outcome Short: follow current diet. Long: adhere to a diet that pertains to him.             Psychosocial: Target Goals: Acknowledge presence or absence of significant depression and/or stress, maximize coping skills, provide positive support system. Participant is able to verbalize types and ability to use techniques and skills needed for reducing stress and depression.   Education: Stress, Anxiety, and Depression - Group verbal and visual presentation to define topics covered.  Reviews how body is impacted by stress, anxiety, and depression.  Also discusses healthy ways to reduce stress and to treat/manage anxiety and depression.  Written material given at graduation. Flowsheet Row Cardiac Rehab from 07/13/2023 in [facility] Cardiac and Pulmonary Rehab  Date 05/11/23  Educator SB  Instruction Review Code 1- [employer] Understanding       Education: Sleep Hygiene -Provides group verbal and written instruction about how sleep can affect your health.  Define sleep hygiene, discuss sleep cycles and impact of sleep habits. Review good sleep hygiene tips.    Initial Review & Psychosocial Screening:  Initial Psych Review & Screening - 04/12/23 1318       Initial Review   Current issues with Current Stress Concerns    Comments healing process, off routine      Family Dynamics   Good Support System? Yes   wife, daughter     Barriers    Psychosocial barriers to participate in program There are no identifiable barriers or psychosocial needs.;The patient should benefit from training in stress management and relaxation.      Screening Interventions   Interventions Encouraged to exercise;Provide feedback about the scores to participant;To provide support and resources with identified psychosocial needs    Expected Outcomes Short Term goal: Utilizing psychosocial counselor, staff and physician to assist with identification of specific Stressors or current issues interfering with healing process. Setting desired goal for each stressor or current issue identified.;Long Term Goal: Stressors or current issues are controlled or eliminated.;Short Term goal: Identification and review with participant of any Quality of Life or Depression concerns found by scoring the questionnaire.;Long Term goal: The participant improves quality of Life and PHQ9 Scores as seen by post scores and/or verbalization of changes             Quality of Life Scores:   Quality of Life - 04/18/23 1749       Quality of Life  Select Quality of Life      Quality of Life Scores   Health/Function Pre 23.18 %    Socioeconomic Pre 24.57 %    Psych/Spiritual Pre 22 %    Family Pre 18.13 %    GLOBAL Pre 22.59 %            Scores of 19 and below usually indicate a poorer quality of life in these areas.  A difference of  2-3 points is a clinically meaningful difference.  A difference of 2-3 points in the total score of the Quality of Life Index has been associated with significant improvement in overall quality of life, self-image, physical symptoms, and general health in studies assessing change in quality of life.  PHQ-9: Review Flowsheet       04/18/2023  Depression screen PHQ 2/9  Decreased Interest 1  Down, Depressed, Hopeless 0  PHQ - 2 Score 1  Altered sleeping 0  Tired, decreased energy 1  Change in appetite 0  Feeling bad or failure about  yourself  0  Trouble concentrating 2  Moving slowly or fidgety/restless 0  Suicidal thoughts 0  PHQ-9 Score 4  Difficult doing work/chores Not difficult at all    Details           Interpretation of Total Score  Total Score Depression Severity:  1-4 = Minimal depression, 5-9 = Mild depression, 10-14 = Moderate depression, 15-19 = Moderately severe depression, 20-27 = Severe depression   Psychosocial Evaluation and Intervention:  Psychosocial Evaluation - 04/12/23 1326       Psychosocial Evaluation & Interventions   Interventions Encouraged to exercise with the program and follow exercise prescription    Comments Jerimiah is 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 He originally went to his doctor for a cough and they found the heart murmer, so needing heart surgery was a surprise. He is currently on medical leave from work with plans to return when his doctor clears him. His wife and daughter are his main support system. He mentions his wife does have other health concerns, but doing well currently. He feels like he is healing well from surgery. 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 He is hopeful with time the nerve issues recover and he is working with his dentist regarding the dental pain. He is looking forward to coming to the program to work on his exercise routine and education    Expected Outcomes Short: attend cardiac rehab for education and exercise. Long: develop and maintain positive self care habits.    Continue Psychosocial Services  Follow up required by staff             Psychosocial Re-Evaluation:  Psychosocial Re-Evaluation     Row Name 05/13/23 0758 06/16/23 0816           Psychosocial Re-Evaluation   Current issues with Current Stress Concerns Current Stress Concerns;None Identified      Comments Eithen is dealing  with some stress due to the unknowns around his work situation. He wants to return to work but does not know when he will be able to yet. He also reports havinmg a good support system at home made up by his wife and grandson. He also reports sleeping good at this time and is using his CPAP. He states to relieve stress he likes to  work on [employer] in his shop and work in his yard. Patient reports no issues with their current mental states, sleep, stress, depression or anxiety. Will follow up with patient in a few weeks for any changes.      Expected Outcomes Short: Continue to exercise for stress relief. Long: Maintain positive outlook. Short: Continue to exercise regularly to support mental health and notify staff of any changes. Long: maintain mental health and well being through teaching of rehab or prescribed medications independently.      Interventions Encouraged to attend Cardiac Rehabilitation for the exercise Encouraged to attend Cardiac Rehabilitation for the exercise      Continue Psychosocial Services  Follow up required by staff Follow up required by staff               Psychosocial Discharge (Final Psychosocial Re-Evaluation):  Psychosocial Re-Evaluation - 06/16/23 0816       Psychosocial Re-Evaluation   Current issues with Current Stress Concerns;None Identified    Comments Patient reports no issues with their current mental states, sleep, stress, depression or anxiety. Will follow up with patient in a few weeks for any changes.    Expected Outcomes Short: Continue to exercise regularly to support mental health and notify staff of any changes. Long: maintain mental health and well being through teaching of rehab or prescribed medications independently.    Interventions Encouraged to attend Cardiac Rehabilitation for the exercise    Continue Psychosocial Services  Follow up required by staff             Vocational Rehabilitation: Provide vocational rehab assistance to  qualifying candidates.   Vocational Rehab Evaluation & Intervention:  Vocational Rehab - 04/12/23 1309       Initial Vocational Rehab Evaluation & Intervention   Assessment shows need for Vocational Rehabilitation No             Education: Education Goals: Education classes will be provided on a variety of topics geared toward better understanding of heart health and risk factor modification. Participant will state understanding/return demonstration of topics presented as noted by education test scores.  Learning Barriers/Preferences:  Learning Barriers/Preferences - 04/12/23 1308       Learning Barriers/Preferences   Learning Barriers None    Learning Preferences None             General Cardiac Education Topics:  AED/CPR: - Group verbal and written instruction with the use of models to demonstrate the basic use of the AED with the basic ABC's of resuscitation.   Anatomy and Cardiac Procedures: - Group verbal and visual presentation and models provide information about basic cardiac anatomy and function. Reviews the testing methods done to diagnose heart disease and the outcomes of the test results. Describes the treatment choices: Medical Management, Angioplasty, or Coronary Bypass Surgery for treating various heart conditions including Myocardial Infarction, Angina, Valve Disease, and Cardiac Arrhythmias.  Written material given at graduation. Flowsheet Row Cardiac Rehab from 07/13/2023 in [facility] Cardiac and Pulmonary Rehab  Education need identified 04/18/23       Medication Safety: - Group verbal and visual instruction to review commonly prescribed medications for heart and lung disease. Reviews the medication, class of the drug, and side effects. Includes the steps to properly store meds and maintain the prescription regimen.  Written material given at graduation. Flowsheet Row Cardiac Rehab from 07/13/2023 in [facility] Cardiac and Pulmonary Rehab  Date 06/29/23   Educator SB  Instruction Review Code 1- Verbalizes Understanding  Intimacy: - Group verbal instruction through game format to discuss how heart and lung disease can affect sexual intimacy. Written material given at graduation.. Flowsheet Row Cardiac Rehab from 07/13/2023 in [facility] Cardiac and Pulmonary Rehab  Date 05/25/23  Educator NT  Instruction Review Code 1- Verbalizes Understanding       Know Your Numbers and Heart Failure: - Group verbal and visual instruction to discuss disease risk factors for cardiac and pulmonary disease and treatment options.  Reviews associated critical values for Overweight/Obesity, Hypertension, Cholesterol, and Diabetes.  Discusses basics of heart failure: signs/symptoms and treatments.  Introduces Heart Failure Zone chart for action plan for heart failure.  Written material given at graduation. Flowsheet Row Cardiac Rehab from 07/13/2023 in [facility] Cardiac and Pulmonary Rehab  Date 07/13/23  Educator SB  Instruction Review Code 1- Verbalizes Understanding       Infection Prevention: - Provides verbal and written material to individual with discussion of infection control including proper hand washing and proper equipment cleaning during exercise session. Flowsheet Row Cardiac Rehab from 07/13/2023 in [facility] Cardiac and Pulmonary Rehab  Date 04/18/23  Educator [facility]  Instruction Review Code 1- Verbalizes Understanding       Falls Prevention: - Provides verbal and written material to individual with discussion of falls prevention and safety. Flowsheet Row Cardiac Rehab from 07/13/2023 in [facility] Cardiac and Pulmonary Rehab  Date 04/18/23  Educator [facility]  Instruction Review Code 1- Verbalizes Understanding       Other: -Provides group and verbal instruction on various topics (see comments)   Knowledge Questionnaire Score:  Knowledge Questionnaire Score - 04/18/23 1749       Knowledge Questionnaire Score   Pre Score 21/26              Core Components/Risk Factors/Patient Goals at Admission:  Personal Goals and Risk Factors at Admission - 04/18/23 1751       Core Components/Risk Factors/Patient Goals on Admission    Weight Management Yes    Intervention Weight Management: Develop a combined nutrition and exercise program designed to reach desired caloric intake, while maintaining appropriate intake of nutrient and fiber, sodium and fats, and appropriate energy expenditure required for the weight goal.;Weight Management: Provide education and appropriate resources to help participant work on and attain dietary goals.    Admit Weight 181 lb 11.2 oz (82.4 kg)    Goal Weight: Short Term 180 lb (81.6 kg)    Goal Weight: Long Term 180 lb (81.6 kg)    Expected Outcomes Short Term: Continue to assess and modify interventions until short term weight is achieved;Long Term: Adherence to nutrition and physical activity/exercise program aimed toward attainment of established weight goal;Weight Maintenance: Understanding of the daily nutrition guidelines, which includes 25-35% calories from fat, 7% or less cal from saturated fats, less than 200mg  cholesterol, less than 1.5gm of sodium, & 5 or more servings of fruits and vegetables daily;Understanding recommendations for meals to include 15-35% energy as protein, 25-35% energy from fat, 35-60% energy from carbohydrates, less than 200mg  of dietary cholesterol, 20-35 gm of total fiber daily;Understanding of distribution of calorie intake throughout the day with the consumption of 4-5 meals/snacks    Lipids Yes    Intervention Provide education and support for participant on nutrition & aerobic/resistive exercise along with prescribed medications to achieve LDL 70mg , HDL >40mg .    Expected Outcomes Short Term: Participant states understanding of desired cholesterol values and is compliant with medications prescribed. Participant is following exercise prescription and nutrition guidelines.;Long  Term: Cholesterol controlled with medications as prescribed, with individualized exercise RX and with personalized nutrition plan. Value goals: LDL < 70mg , HDL > 40 mg.             Education:Diabetes - Individual verbal and written instruction to review signs/symptoms of diabetes, desired ranges of glucose level fasting, after meals and with exercise. Acknowledge that pre and post exercise glucose checks will be done for 3 sessions at entry of program.   Core Components/Risk Factors/Patient Goals Review:   Goals and Risk Factor Review     Row Name 05/13/23 0806 06/16/23 0829           Core Components/Risk Factors/Patient Goals Review   Personal Goals Review Weight Management/Obesity;Lipids Hypertension;Other      Review Conan states that he is comfortable with where his weight is right now and would like to work on maintenance. He weighed in today at 180.7 lbs. He also reports that he has been taking all of his medications as prescribed and exercising regularly to control his lipids. We will continue to check in with Jonny Ruiz regarding his risk factors. Badr is doing well in the program and is managing his weight. His blood pressure has been within normal limits. He has been able to increase his levels on all machines and bought a new piece of exercise equipment.      Expected Outcomes Short: Continue to take all medications as prescribed. Long: Continue to monitor lifestyle risk factors. Short: [employer] [facility]. Long: maintain exercise independently.               Core Components/Risk Factors/Patient Goals at Discharge (Final Review):   Goals and Risk Factor Review - 06/16/23 0829       Core Components/Risk Factors/Patient Goals Review   Personal Goals Review Hypertension;Other    Review Yani is doing well in the program and is managing his weight. His blood pressure has been within normal limits. He has been able to increase his levels on all machines and bought a new piece of  exercise equipment.    Expected Outcomes Short: [employer] [facility]. Long: maintain exercise independently.             ITP Comments:  ITP Comments     Row Name 04/12/23 1315 04/18/23 1717 04/19/23 1353 04/22/23 0821 05/18/23 1113   ITP Comments Initial phone call completed. Diagnosis can be found in [facility] 6/4. EP Orientation scheduled for Monday 7/8 at 3pm. Completed and gym orientation. Initial ITP created and sent for review to Dr. Bethann Punches, Medical Director. 30 Day review completed. Medical Director ITP review done, changes made as directed, and signed approval by Medical Director.    new to program First full day of exercise!  Patient was oriented to gym and equipment including functions, settings, policies, and procedures.  Patient's individual exercise prescription and treatment plan were reviewed.  All starting workloads were established based on the results of the 6 minute walk test done at initial orientation visit.  The plan for exercise progression was also introduced and progression will be customized based on patient's performance and goals. 30 Day review completed. Medical Director ITP review done, changes made as directed, and signed approval by Medical Director.   new to program    Row Name 06/15/23 0924 07/13/23 0949 07/27/23 0746       ITP Comments 30 Day review completed. Medical Director ITP review done, changes made as directed, and signed approval by Medical Director. 30 Day review completed. Medical  Director ITP review done, changes made as directed, and signed approval by Wellsite geologist. Susana graduated today from  rehab with 36 sessions completed.  Details of the patient's exercise prescription and what He needs to do in order to continue the prescription and progress were discussed with patient.  Patient was given a copy of prescription and goals.  Patient verbalized understanding. Shermon plans to continue to exercise by walking at home and at the park, and using  resistance bands.              Comments: Discharge ITP

## 2023-07-27 NOTE — Progress Notes (Signed)
Discharge Summary:  Michael Mckenzie  (DOB: 04-Apr-1961)   [name] graduated today from  rehab with 36 sessions completed.  Details of the patient's exercise prescription and what He needs to do in order to continue the prescription and progress were discussed with patient.  Patient was given a copy of prescription and goals.  Patient verbalized understanding. [name] plans to continue to exercise by walking at home and at the park, and using resistance bands.   6 Minute Walk     Row Name 04/18/23 1717 07/08/23 0807       6 Minute Walk   Phase Initial Discharge    Distance 1540 feet 1840 feet    Distance % Change -- 19 %    Distance Feet Change -- 300 ft    Walk Time 6 minutes 6 minutes    # of Rest Breaks 0 0    MPH 2.92 3.48    METS 4.23 4.55    RPE 10 13    Perceived Dyspnea  0 0    VO2 Peak 14.8 15.92    Symptoms No No    Resting HR 85 bpm 83 bpm    Resting BP 132/80 110/58    Resting Oxygen Saturation  97 % 97 %    Exercise Oxygen Saturation  during 6 min walk 99 % 93 %    Max Ex. HR 119 bpm 132 bpm    Max Ex. BP 162/82 126/62    2 Minute Post BP 132/80 --

## 2023-07-27 NOTE — Progress Notes (Signed)
Daily Session Note  Patient Details  Name: Michael Mckenzie MRN: [id number] Date of Birth: Jun 10, 1961 Referring Provider:   Flowsheet Row Cardiac Rehab from 04/18/2023 in [facility] Cardiac and Pulmonary Rehab  Referring Provider Dr. Sampson Goon       Encounter Date: 07/27/2023  Check In:  Session Check In - 07/27/23 0743       Check-In   Supervising physician immediately available to respond to emergencies See telemetry face sheet for immediately available ER MD    Location [facility]-Cardiac & Pulmonary Rehab    Staff Present Rory Percy, MS, Exercise Physiologist;Joseph Reino Kent, RCP,RRT,BSRT;Maxon [place] BS, , Exercise Physiologist;Jenice Leiner Katrinka Blazing, RN, ADN    Virtual Visit No    Medication changes reported     No    Fall or balance concerns reported    No    Warm-up and Cool-down Performed on first and last piece of equipment    Resistance Training Performed Yes    VAD Patient? No    PAD/SET Patient? No      Pain Assessment   Currently in Pain? No/denies                Social History   Tobacco Use  Smoking Status Never  Smokeless Tobacco Former   Quit date: 08/26/2011    Goals Met:  Independence with exercise equipment Exercise tolerated well No report of concerns or symptoms today Strength training completed today  Goals Unmet:  Not Applicable  Comments:  Michael Mckenzie graduated today from  rehab with 36 sessions completed.  Details of the patient's exercise prescription and what He needs to do in order to continue the prescription and progress were discussed with patient.  Patient was given a copy of prescription and goals.  Patient verbalized understanding. Michael Mckenzie plans to continue to exercise by walking at home and at the park, and using resistance bands.    Dr. Bethann Punches is Medical Director for [facility] Cardiac Rehabilitation.  Dr. Vida Rigger is Medical Director for [facility] Pulmonary Rehabilitation.

## 2023-07-28 ENCOUNTER — Ambulatory Visit: Payer: 59

## 2023-08-01 ENCOUNTER — Ambulatory Visit: Payer: 59

## 2023-09-29 IMAGING — MR MR LUMBAR SPINE WO/W CM
6 of 7 series · 30 of 48 positions shown · IV contrast (gadavist)
Comparison: MR lumbar 05/15/2019, 01/12/2018.

CLINICAL DATA: Low back pain, unspecified - M54.50. Low back with
right-sided sciatica increased pain over the last couple of months
that radiates in the buttock with right toe numbness. History of
lumbar surgery in 9681.

EXAM:
MRI LUMBAR SPINE WITHOUT AND WITH CONTRAST
TECHNIQUE: Multiplanar and multiecho pulse sequences of the lumbar spine were
obtained without and with intravenous contrast.
CONTRAST:  7mL GADAVIST GADOBUTROL 1 MMOL/ML IV SOLN

[Series 5: T2 · sagittal · 4.0mm · 0.88mm/px · 4 of 17 slices shown (1 of 2)]
[im 1/17]
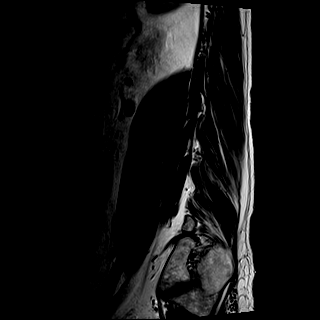
[im 6/17]
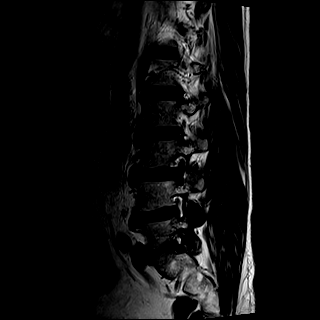
[im 11/17]
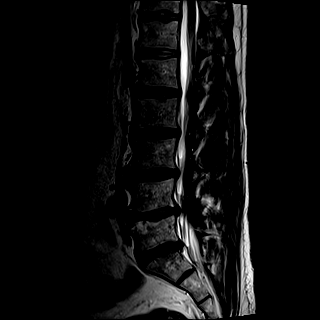
[im 17/17]
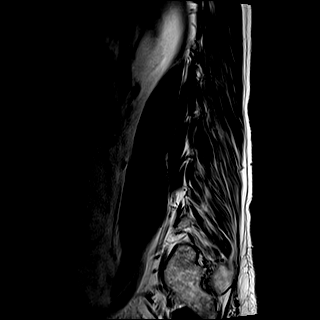

[Series 6: T1 · sagittal · 4.0mm · 0.88mm/px · 4 of 17 slices shown (1 of 2)]
[im 1/17]
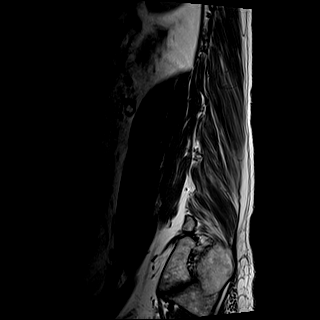
[im 6/17]
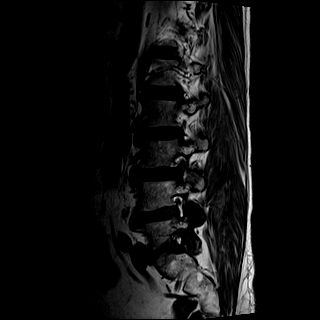
[im 11/17]
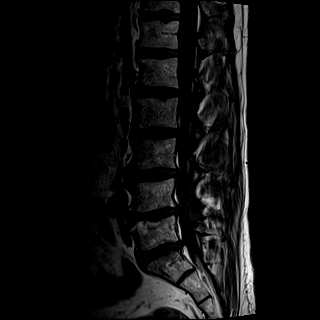
[im 17/17]
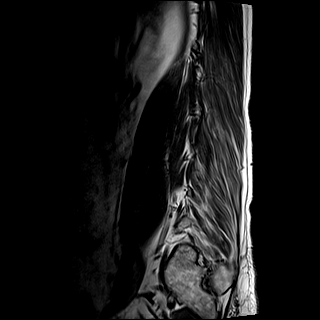

[Series 7: STIR · sagittal · 4.0mm · 0.44mm/px · 1 of 17 slices shown]
[im 1/17]
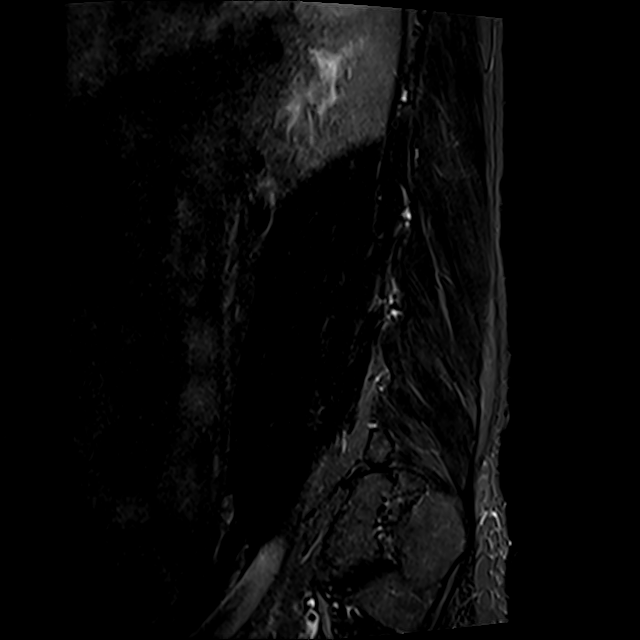

[Series 8: T2 · axial · 4.0mm · 0.78mm/px · z∈[-115,+93]mm · 8 of 36 slices shown (2 of 2)]
[im 1/36]
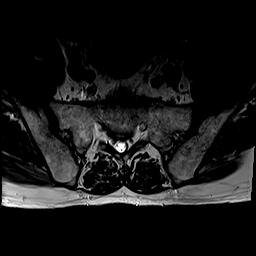
[im 4/36]
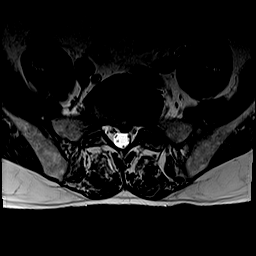
[im 12/36]
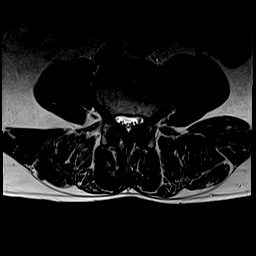
[im 16/36]
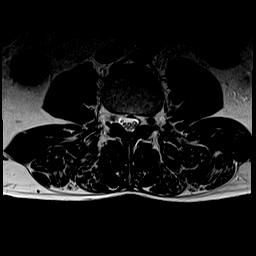
[im 20/36]
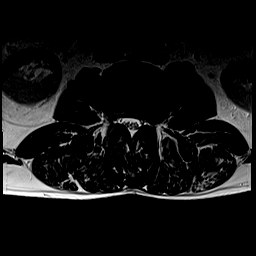
[im 24/36]
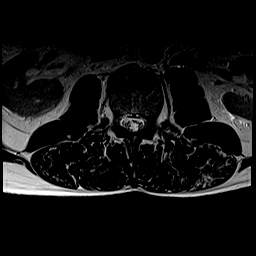
[im 32/36]
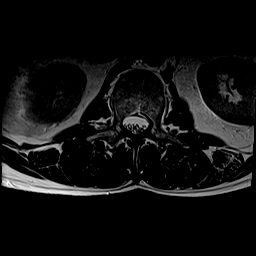
[im 36/36]
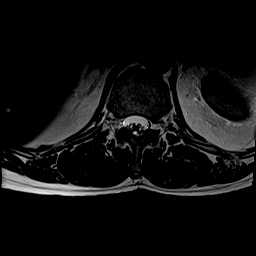

[Series 9: T1 · axial · 4.0mm · 0.39mm/px · z∈[-115,+93]mm · 8 of 36 slices shown (2 of 2)]
[im 1/36]
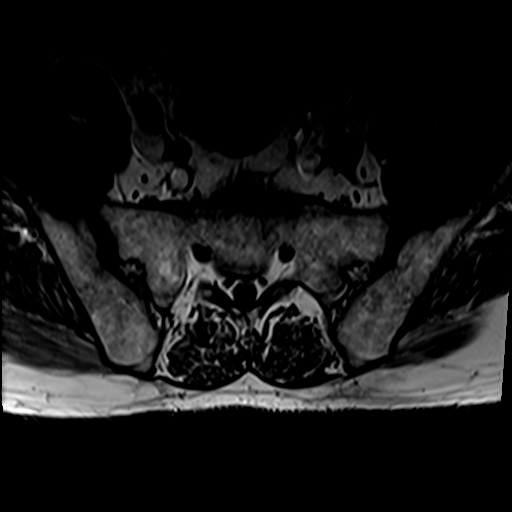
[im 4/36]
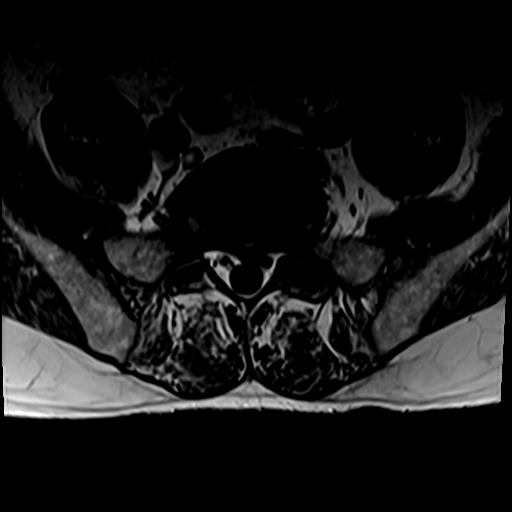
[im 12/36]
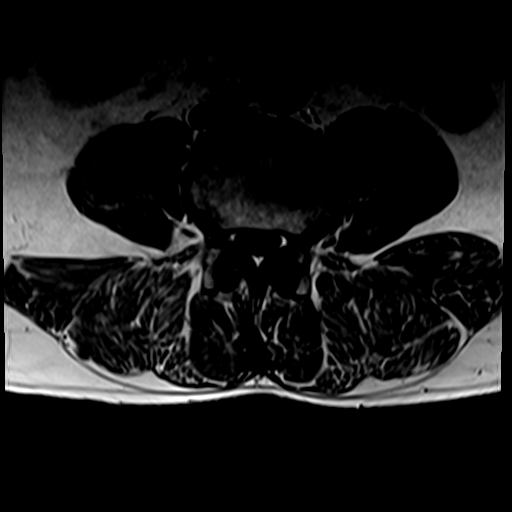
[im 16/36]
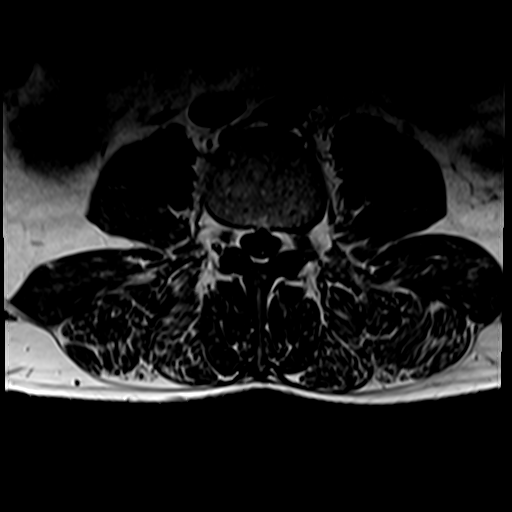
[im 20/36]
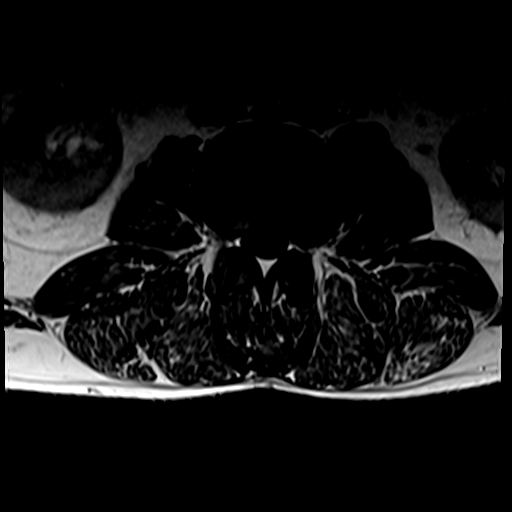
[im 24/36]
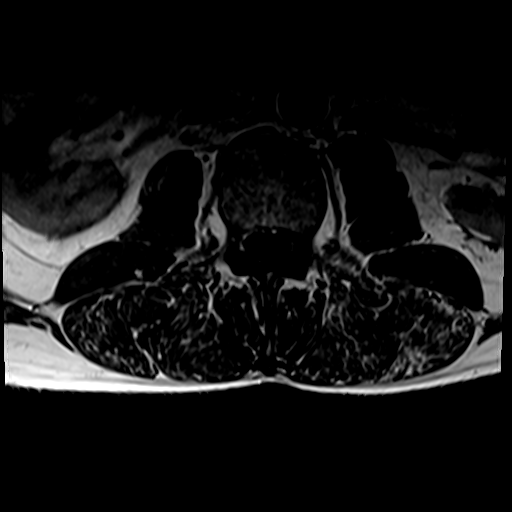
[im 32/36]
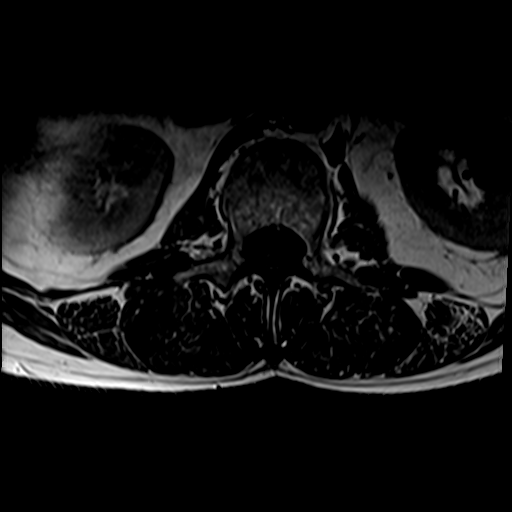
[im 36/36]
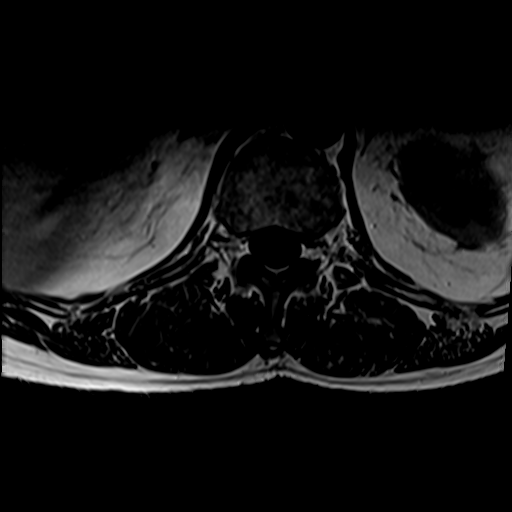

[Series 10: T1 fat-sat post-contrast · sagittal · 4.0mm · 0.88mm/px · 5 of 17 slices shown]
[im 1/17]
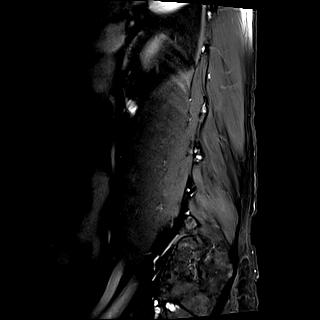
[im 5/17]
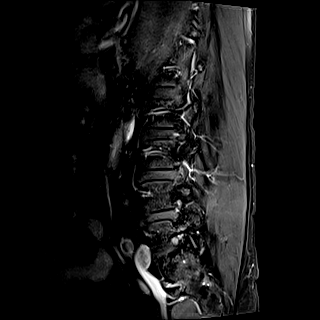
[im 9/17]
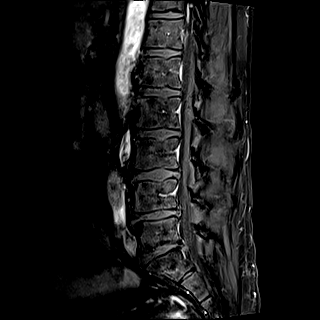
[im 13/17]
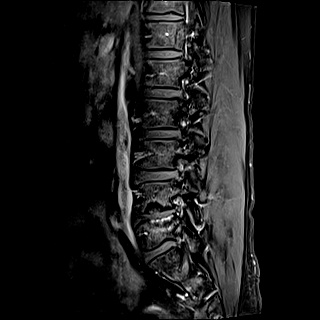
[im 17/17]
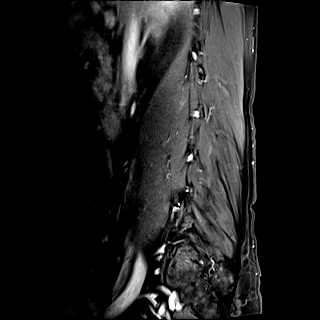

[30 of 48 positions shown; findings below may reference images not displayed]

FINDINGS: Segmentation:  Standard.

Alignment:  Physiologic.

Vertebrae: No acute fracture, evidence of discitis, or aggressive
bone lesion.

Conus medullaris and cauda equina: Conus extends to the T12 level.
Stable 7 mm intradural mass at the level of L2 with mild enhancement
unchanged compared with the prior examination of 01/12/2018.
Differential considerations include an epidermoid versus a
schwannoma.

Paraspinal and other soft tissues: No acute paraspinal abnormality.

Disc levels:

Disc spaces: Disc desiccation of the lumbar spine. Mild disc height
loss at L4-5 and L5-S1.

T12-L1: No significant disc bulge. No neural foraminal stenosis. No
central canal stenosis. Mild bilateral facet arthropathy.

L1-L2: Mild broad-based disc bulge. Mild bilateral facet
arthropathy. No foraminal stenosis. Mild spinal stenosis.

L2-L3: Mild broad-based disc bulge. Mild bilateral facet
arthropathy. Mild spinal stenosis. No foraminal stenosis.

L3-L4: Mild broad-based disc bulge with a broad left paracentral
disc protrusion with mass effect on the left L3 nerve root. Moderate
bilateral facet arthropathy. No right foraminal stenosis. Moderate
left foraminal stenosis. Mild-moderate spinal stenosis.

L4-L5: Broad-based disc bulge with a broad right paracentral disc
protrusion. Moderate bilateral facet arthropathy. Right subarticular
recess stenosis. Moderate right foraminal stenosis. No left
foraminal stenosis. Mild spinal stenosis.

L5-S1: Broad-based disc bulge with a central disc protrusion.
Moderate left foraminal stenosis. Mild right foraminal stenosis. No
central canal stenosis.
IMPRESSION: 1. At L3-4 there is a mild broad-based disc bulge with a broad left
paracentral disc protrusion with mass effect on the left L3 nerve
root. Moderate bilateral facet arthropathy. No right foraminal
stenosis. Moderate left foraminal stenosis. Mild-moderate spinal
stenosis.
2. At L4-5 there is a broad-based disc bulge with a broad right
paracentral disc protrusion. Moderate bilateral facet arthropathy.
Right subarticular recess stenosis. Moderate right foraminal
stenosis. No left foraminal stenosis. Mild spinal stenosis.
3. At L5-S1 there is a broad-based disc bulge with a central disc
protrusion. Mild right foraminal stenosis.
4. Stable 7 mm intradural mass at the level of L2 with mild
enhancement unchanged compared with the prior examination of
01/12/2018. Differential considerations include an epidermoid versus
a schwannoma.

## 2024-04-19 ENCOUNTER — Other Ambulatory Visit: Payer: Self-pay | Admitting: Family Medicine

## 2024-04-19 DIAGNOSIS — M5416 Radiculopathy, lumbar region: Secondary | ICD-10-CM

## 2024-04-23 ENCOUNTER — Encounter: Payer: Self-pay | Admitting: Family Medicine

## 2024-04-27 ENCOUNTER — Ambulatory Visit
Admission: RE | Admit: 2024-04-27 | Discharge: 2024-04-27 | Disposition: A | Discharge status: Home / Self Care | Source: Ambulatory Visit | Attending: Family Medicine | Admitting: Family Medicine

## 2024-04-27 DIAGNOSIS — M5416 Radiculopathy, lumbar region: Secondary | ICD-10-CM

## 2024-04-27 MED ORDER — GADOPICLENOL 0.5 MMOL/ML IV SOLN
8.0000 mL | Freq: Once | INTRAVENOUS | Status: AC | PRN
  Administered 2024-04-27: 8 mL via INTRAVENOUS

## 2024-05-07 ENCOUNTER — Other Ambulatory Visit: Payer: Self-pay | Admitting: Family Medicine

## 2024-05-08 ENCOUNTER — Other Ambulatory Visit: Payer: Self-pay | Admitting: Family Medicine

## 2024-05-08 ENCOUNTER — Inpatient Hospital Stay
Admission: RE | Admit: 2024-05-08 | Discharge: 2024-05-08 | Disposition: A | Discharge status: Home / Self Care | Payer: Self-pay | Source: Ambulatory Visit | Attending: Orthopedic Surgery | Admitting: Orthopedic Surgery

## 2024-05-08 DIAGNOSIS — Z049 Encounter for examination and observation for unspecified reason: Secondary | ICD-10-CM

## 2024-05-11 NOTE — Progress Notes (Signed)
 Referring Physician:  Adrianna Evalene SQUIBB, MD [id number] [facility] [address] [place],  [place] [address]  Primary Physician:  Adrianna Evalene SQUIBB, MD  History of Present Illness: 05/16/2024 Michael Mckenzie has a history of OSA, dyslipidemia, history of mitral valve repair.   He sees PMR at [facility] for his lumbar spine. 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 He saw [facility] for this on 11/03/21- it was stable at that time from MRI in 2019.   He has seen improvement with PT and injections in the past. Last flare up was in 2023 and current one started about 6 weeks ago. His pain has improved since that time.   He has intermittent LBP that is improving. He also has intermittent tingling in left lateral thigh with standing. It gets better when he sits. He is able to do yardwork and be active with no issues. No weakness.   He is not taking any medications for his back.   Tobacco use: Does not smoke.   Bowel/Bladder Dysfunction: none  Conservative measures:  Physical therapy: did PT at [facility] in 2023  Multimodal medical therapy including regular antiinflammatories: flexeril , tylenol , neurontin, mobic, prednisone Injections:  12/10/2021: Left L3-4 transforaminal ESI (95% relief)   Past Surgery: cervical fusion in 2020  The symptoms are causing a significant impact on the patient's life.   Review of Systems:  A 10 point review of systems is negative, except for the pertinent positives and negatives detailed in the HPI.  Past Medical History: Past Medical History:  Diagnosis Date   Sleep apnea     Past Surgical History: Past Surgical History:  Procedure Laterality Date   CARPAL TUNNEL RELEASE Right 09/15/2021   Procedure: RIGHT CARPAL TUNNEL RELEASE;  Surgeon: Murrell Kuba, MD;  Location: [facility];  Service: Orthopedics;  Laterality: Right;   MITRAL VALVE SURGERY  03/16/2023   ULNAR NERVE TRANSPOSITION Right 09/15/2021    Procedure: DECOMPRESSION WITH TRANSPOSITION ULNAR NERVE ELBOW AND ULNAR NERVE WRIST;  Surgeon: Murrell Kuba, MD;  Location: [facility];  Service: Orthopedics;  Laterality: Right;    Allergies: Allergies as of 05/16/2024   (No Known Allergies)    Medications: Outpatient Encounter Medications as of 05/16/2024  Medication Sig   aspirin EC 81 MG tablet Take 81 mg by mouth daily.   atorvastatin (LIPITOR) 40 MG tablet Take 40 mg by mouth daily.   Calcium Citrate-Vitamin D (CALCIUM CITRATE + D PO) Take by mouth daily.   GLUCOSAMINE-CHONDROITIN PO Take by mouth daily.   Multiple Vitamin (MULTIVITAMIN ADULT PO) Take 1 tablet by mouth daily.   Omega-3 Fatty Acids (FISH OIL PO) Take by mouth.   Potassium 99 MG TABS Take 1 tablet by mouth daily.   [DISCONTINUED] amiodarone (PACERONE) 200 MG tablet Take 200 mg by mouth daily.   [DISCONTINUED] atorvastatin (LIPITOR) 40 MG tablet Take 40 mg by mouth daily.   [DISCONTINUED] cyclobenzaprine  (FLEXERIL ) 5 MG tablet Take 1-2 tablets 3 times daily as needed   [DISCONTINUED] furosemide (LASIX) 20 MG tablet Take 20 mg by mouth daily.   No facility-administered encounter medications on file as of 05/16/2024.    Social History: Social History   Tobacco Use   Smoking status: Never   Smokeless tobacco: Former    Quit date: 08/26/2011  Vaping Use   Vaping status: Never Used  Substance Use Topics   Alcohol use: Never   Drug use: Never  Family Medical History: History reviewed. No pertinent family history.  Physical Examination: Vitals:   05/16/24 1306  BP: 110/82    General: Patient is well developed, well nourished, calm, collected, and in no apparent distress. Attention to examination is appropriate.  Respiratory: Patient is breathing without any difficulty.   NEUROLOGICAL:     Awake, alert, oriented to person, place, and time.  Speech is clear and fluent. Fund of knowledge is appropriate.   Cranial Nerves: Pupils equal  round and reactive to light.  Facial tone is symmetric.    No posterior lumbar tenderness.   No abnormal lesions on exposed skin.   Strength: Side Biceps Triceps Deltoid Interossei Grip Wrist Ext. Wrist Flex.  R 5 5 5 5 5 5 5   L 5 5 5 5 5 5 5    Side Iliopsoas Quads Hamstring PF DF EHL  R 5 5 5 5 5 5   L 5 5 5 5 5 5    Reflexes are 2+ and symmetric at the biceps, brachioradialis, patella and achilles.   Hoffman's is absent.  Clonus is not present.   Bilateral upper and lower extremity sensation is intact to light touch.     No pain with IR/ER of both hips.   Gait is normal.    Medical Decision Making  Imaging: Lumbar xrays dated 04/19/24:  FINDINGS:  Regional soft tissues unremarkable.  No aggressive osseus lesion.  Vertebral body heights are intact.  Multilevel disc height loss.  This is greatest at L4-5 where it is  moderate and mildly increased from prior.  Moderate L5-S1 facet hypertrophy.  This is similar to prior.   Impression  1.  No acute osseous abnormality. 2.  Slight interval progression of degenerative disc disease, greatest at L4-5 where it is moderate.  BRENDAN CHRISTOPHER CLINE, MD     MRI of lumbar spine dated 04/27/24:  FINDINGS: Segmentation: Standard.   Alignment:  Physiologic lumbar alignment is maintained.   Vertebrae: Vertebral bodies demonstrate normal signal intensity. No compression fractures.   Conus medullaris and cauda equina: The conus medullaris terminates at the level of L1-L2. The distal spinal cord signal intensity is normal. Congenital narrowing of the canal. Intradural lesion at the level of L2 measuring up to 0.7 cm in greatest axial dimension and up to 2.4 cm in craniocaudal dimension. There is faint enhancement centrally within this lesion.   Paraspinal and other soft tissues: The visualized abdomen and pelvis show no soft tissue abnormality. The visualized aorta is normal.   Disc levels:   L1-L2: Mild disc bulge.  Moderate bilateral facet arthropathy. No neuroforaminal stenosis. No spinal canal stenosis.   L2-L3: Disc bulge. Moderate bilateral facet arthropathy. Mild left neuroforaminal stenosis. No spinal canal stenosis.   L3-L4: Disc bulge with left foraminal disc extrusion. Moderate bilateral facet arthropathy. Severe left and mild right neuroforaminal stenosis. Moderate spinal canal stenosis.   L4-L5: Disc bulge. Severe facet arthropathy. Severe right and mild left neuroforaminal stenosis. Mild spinal canal stenosis.   L5-S1: Disc bulge. Moderate bilateral facet arthropathy. Severe left and moderate right neuroforaminal stenosis. No spinal canal stenosis.   IMPRESSION: 1. Intradural lesion at the level of L2 measuring up to 2.4 cm in craniocaudal dimension. Faint enhancement centrally within this lesion. 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 2. Multilevel lumbar spondylosis with moderate spinal canal stenosis at L3-L4. 3. Severe left foraminal stenosis at L3-L4 secondary to left foraminal disc extrusion. 4. Severe right foraminal stenosis at L4-L5. 5. Severe left  and moderate right foraminal stenosis at L5-S1.     Electronically Signed   By: Clem Savory M.D.   On: 04/27/2024 15:47  I have personally reviewed the images and agree with the above interpretation.  Assessment and Plan: Michael Mckenzie is here for follow up on a intradural lesion at L2. He had a flare up of pain about 6 weeks ago but is doing better.   He has intermittent LBP that is improving. He also has intermittent tingling in left lateral thigh with standing. It gets better when he sits.   He has known lumbar spondylosis with multilevel foraminal stenosis and moderate central stenosis L3-L4. Intradural lesion at L2 appears unchanged from previous imaging.   Clinically, he looks great.   Treatment options discussed with patient and following plan made:   - He was given lumbar HEP from  [place]. Agree with starting this.  - He can continue with activity as tolerated.  - Lesion at L2 appears stable. Will review with Dr. Clois and message him regarding any further imaging.  - He will follow up prn.   I spent a total of 25 minutes in face-to-face and non-face-to-face activities related to this patient's care today including review of outside records, review of imaging, review of symptoms, physical exam, discussion of differential diagnosis, discussion of treatment options, and documentation.   Thank you for involving me in the care of this patient.   Glade Boys PA-C Dept. of Neurosurgery

## 2024-05-16 ENCOUNTER — Ambulatory Visit: Admitting: Orthopedic Surgery

## 2024-05-16 ENCOUNTER — Encounter: Payer: Self-pay | Admitting: Orthopedic Surgery

## 2024-05-16 VITALS — BP 110/82 | Ht 70.0 in | Wt 185.6 lb

## 2024-05-16 DIAGNOSIS — M48061 Spinal stenosis, lumbar region without neurogenic claudication: Secondary | ICD-10-CM | POA: Diagnosis not present

## 2024-05-16 DIAGNOSIS — M4726 Other spondylosis with radiculopathy, lumbar region: Secondary | ICD-10-CM

## 2024-05-16 DIAGNOSIS — M47816 Spondylosis without myelopathy or radiculopathy, lumbar region: Secondary | ICD-10-CM

## 2024-05-16 DIAGNOSIS — M5416 Radiculopathy, lumbar region: Secondary | ICD-10-CM

## 2024-05-18 DIAGNOSIS — C719 Malignant neoplasm of brain, unspecified: Secondary | ICD-10-CM

## 2024-05-22 NOTE — Telephone Encounter (Signed)
 Noted. I have made a note in the referral and deferred this.
